# Patient Record
Sex: Male | Born: 1935 | Race: White | Hispanic: No | Smoking: Former smoker
Health system: Southern US, Community
[De-identification: ages and names within clinical notes are randomized; demographics above are authoritative.]

## PROBLEM LIST (undated history)

## (undated) DIAGNOSIS — K8020 Calculus of gallbladder without cholecystitis without obstruction: Secondary | ICD-10-CM

## (undated) DIAGNOSIS — C61 Malignant neoplasm of prostate: Secondary | ICD-10-CM

## (undated) DIAGNOSIS — K219 Gastro-esophageal reflux disease without esophagitis: Secondary | ICD-10-CM

## (undated) DIAGNOSIS — R972 Elevated prostate specific antigen [PSA]: Secondary | ICD-10-CM

## (undated) DIAGNOSIS — J309 Allergic rhinitis, unspecified: Secondary | ICD-10-CM

## (undated) DIAGNOSIS — K259 Gastric ulcer, unspecified as acute or chronic, without hemorrhage or perforation: Secondary | ICD-10-CM

## (undated) DIAGNOSIS — I2510 Atherosclerotic heart disease of native coronary artery without angina pectoris: Secondary | ICD-10-CM

## (undated) DIAGNOSIS — IMO0002 Reserved for concepts with insufficient information to code with codable children: Secondary | ICD-10-CM

## (undated) DIAGNOSIS — M1710 Unilateral primary osteoarthritis, unspecified knee: Secondary | ICD-10-CM

## (undated) DIAGNOSIS — I4891 Unspecified atrial fibrillation: Secondary | ICD-10-CM

## (undated) DIAGNOSIS — R600 Localized edema: Secondary | ICD-10-CM

## (undated) DIAGNOSIS — L0292 Furuncle, unspecified: Secondary | ICD-10-CM

## (undated) DIAGNOSIS — L0293 Carbuncle, unspecified: Secondary | ICD-10-CM

## (undated) HISTORY — DX: Elevated prostate specific antigen (PSA): R97.20

## (undated) HISTORY — DX: Calculus of gallbladder without cholecystitis without obstruction: K80.20

## (undated) HISTORY — DX: Furuncle, unspecified: L02.92

## (undated) HISTORY — DX: Allergic rhinitis, unspecified: J30.9

## (undated) HISTORY — DX: Gastro-esophageal reflux disease without esophagitis: K21.9

## (undated) HISTORY — DX: Gastric ulcer, unspecified as acute or chronic, without hemorrhage or perforation: K25.9

## (undated) HISTORY — DX: Unspecified atrial fibrillation: I48.91

## (undated) HISTORY — PX: OTHER SURGICAL HISTORY: SHX169

## (undated) HISTORY — PX: TOTAL HIP ARTHROPLASTY: SHX124

## (undated) HISTORY — DX: Carbuncle, unspecified: L02.93

## (undated) HISTORY — DX: Unilateral primary osteoarthritis, unspecified knee: M17.10

## (undated) HISTORY — DX: Atherosclerotic heart disease of native coronary artery without angina pectoris: I25.10

## (undated) HISTORY — DX: Localized edema: R60.0

## (undated) HISTORY — DX: Reserved for concepts with insufficient information to code with codable children: IMO0002

---

## 2000-02-28 ENCOUNTER — Encounter: Payer: Self-pay | Admitting: Emergency Medicine

## 2000-02-28 ENCOUNTER — Inpatient Hospital Stay (HOSPITAL_COMMUNITY): Admission: EM | Admit: 2000-02-28 | Discharge: 2000-02-29 | Payer: Self-pay | Admitting: Emergency Medicine

## 2000-12-21 DIAGNOSIS — I4891 Unspecified atrial fibrillation: Secondary | ICD-10-CM

## 2000-12-21 HISTORY — DX: Unspecified atrial fibrillation: I48.91

## 2001-03-19 ENCOUNTER — Inpatient Hospital Stay (HOSPITAL_COMMUNITY): Admission: EM | Admit: 2001-03-19 | Discharge: 2001-03-23 | Payer: Self-pay | Admitting: Emergency Medicine

## 2001-03-19 ENCOUNTER — Encounter: Payer: Self-pay | Admitting: Emergency Medicine

## 2001-03-21 ENCOUNTER — Encounter: Payer: Self-pay | Admitting: Internal Medicine

## 2001-03-22 ENCOUNTER — Encounter: Payer: Self-pay | Admitting: Internal Medicine

## 2003-12-22 DIAGNOSIS — K8020 Calculus of gallbladder without cholecystitis without obstruction: Secondary | ICD-10-CM

## 2003-12-22 HISTORY — DX: Calculus of gallbladder without cholecystitis without obstruction: K80.20

## 2004-04-26 ENCOUNTER — Emergency Department (HOSPITAL_COMMUNITY): Admission: EM | Admit: 2004-04-26 | Discharge: 2004-04-26 | Payer: Self-pay

## 2004-04-29 ENCOUNTER — Inpatient Hospital Stay (HOSPITAL_COMMUNITY): Admission: EM | Admit: 2004-04-29 | Discharge: 2004-04-29 | Payer: Self-pay | Admitting: Emergency Medicine

## 2004-05-05 ENCOUNTER — Encounter: Admission: RE | Admit: 2004-05-05 | Discharge: 2004-05-05 | Payer: Self-pay | Admitting: Family Medicine

## 2004-06-30 ENCOUNTER — Ambulatory Visit (HOSPITAL_COMMUNITY): Admission: RE | Admit: 2004-06-30 | Discharge: 2004-06-30 | Payer: Self-pay | Admitting: Internal Medicine

## 2004-07-22 ENCOUNTER — Encounter (INDEPENDENT_AMBULATORY_CARE_PROVIDER_SITE_OTHER): Payer: Self-pay | Admitting: Specialist

## 2004-07-22 ENCOUNTER — Ambulatory Visit (HOSPITAL_COMMUNITY): Admission: RE | Admit: 2004-07-22 | Discharge: 2004-07-22 | Payer: Self-pay | Admitting: Internal Medicine

## 2005-01-05 ENCOUNTER — Ambulatory Visit: Payer: Self-pay | Admitting: Internal Medicine

## 2005-03-16 ENCOUNTER — Ambulatory Visit (HOSPITAL_COMMUNITY): Admission: RE | Admit: 2005-03-16 | Discharge: 2005-03-16 | Payer: Self-pay | Admitting: Internal Medicine

## 2005-03-16 ENCOUNTER — Ambulatory Visit: Payer: Self-pay | Admitting: Internal Medicine

## 2005-03-23 ENCOUNTER — Ambulatory Visit: Payer: Self-pay | Admitting: Internal Medicine

## 2005-05-12 ENCOUNTER — Encounter: Payer: Self-pay | Admitting: Internal Medicine

## 2005-06-27 ENCOUNTER — Emergency Department (HOSPITAL_COMMUNITY): Admission: EM | Admit: 2005-06-27 | Discharge: 2005-06-27 | Payer: Self-pay | Admitting: Emergency Medicine

## 2005-06-30 ENCOUNTER — Ambulatory Visit: Payer: Self-pay | Admitting: Internal Medicine

## 2005-06-30 ENCOUNTER — Inpatient Hospital Stay (HOSPITAL_COMMUNITY): Admission: EM | Admit: 2005-06-30 | Discharge: 2005-07-03 | Payer: Self-pay | Admitting: Internal Medicine

## 2005-07-02 ENCOUNTER — Ambulatory Visit: Payer: Self-pay | Admitting: Gastroenterology

## 2005-07-07 ENCOUNTER — Ambulatory Visit: Payer: Self-pay | Admitting: Internal Medicine

## 2005-07-10 ENCOUNTER — Ambulatory Visit: Payer: Self-pay | Admitting: Internal Medicine

## 2005-07-14 ENCOUNTER — Encounter: Payer: Self-pay | Admitting: Internal Medicine

## 2005-08-31 ENCOUNTER — Ambulatory Visit: Payer: Self-pay | Admitting: Internal Medicine

## 2005-10-22 ENCOUNTER — Ambulatory Visit: Payer: Self-pay | Admitting: Internal Medicine

## 2006-02-12 ENCOUNTER — Ambulatory Visit: Payer: Self-pay | Admitting: Internal Medicine

## 2006-02-17 ENCOUNTER — Ambulatory Visit: Payer: Self-pay

## 2006-07-08 IMAGING — CT CT ABDOMEN W/ CM
1 of 5 series · 15 of 32 positions shown, 19 images · non-contrast
Comparison: none

CLINICAL DATA: Left lower quadrant pain times 2 months.  Followup inflammation of the left rib. 
 Comparison study:  04/28/04.

[Series 2: routine abdomen · axial · 0.78mm/px · z∈[-512,-156]mm · 15 of 118 slices shown, 19 images]
[im 9/118  soft-tissue]
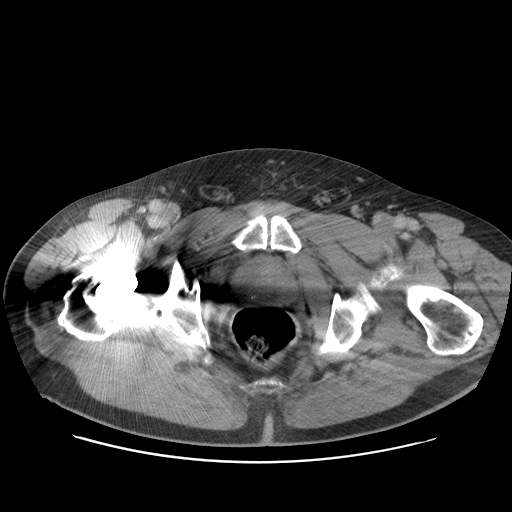
[im 9/118  bone]
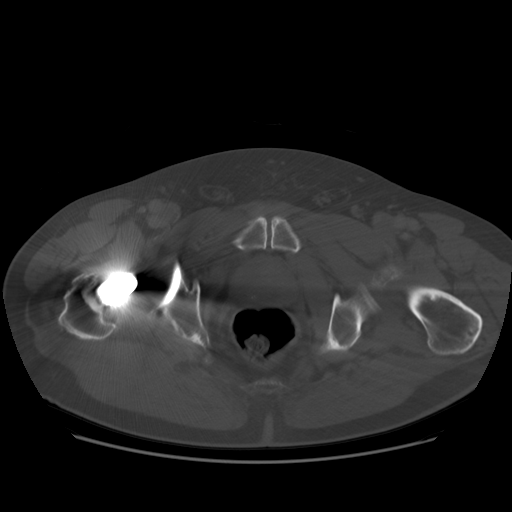
[im 18/118  soft-tissue]
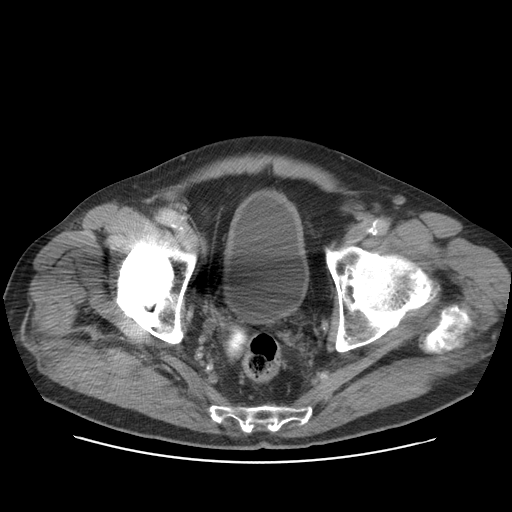
[im 27/118  soft-tissue]
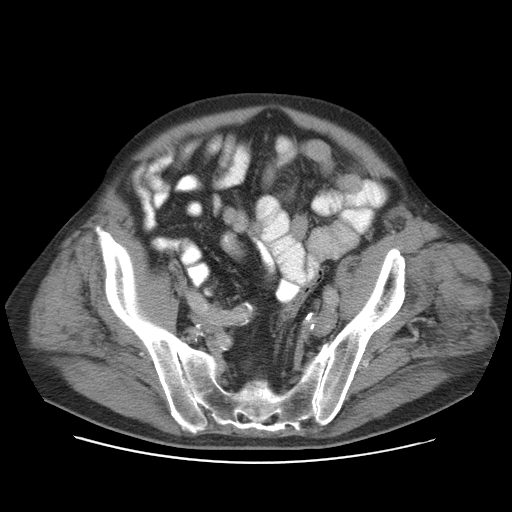
[im 35/118  soft-tissue]
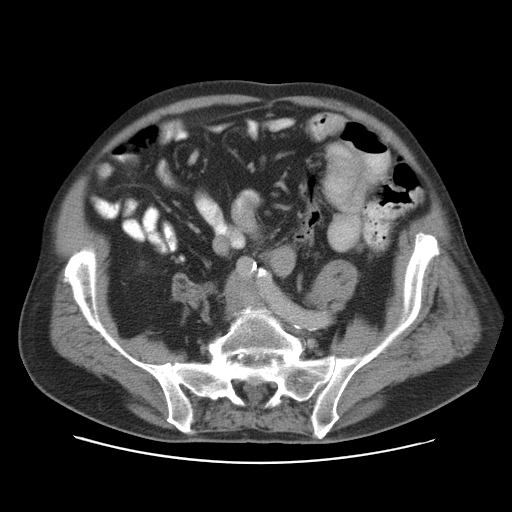
[im 44/118  soft-tissue]
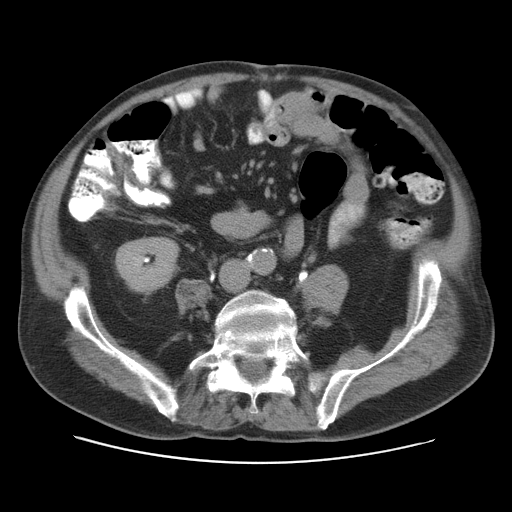
[im 53/118  soft-tissue]
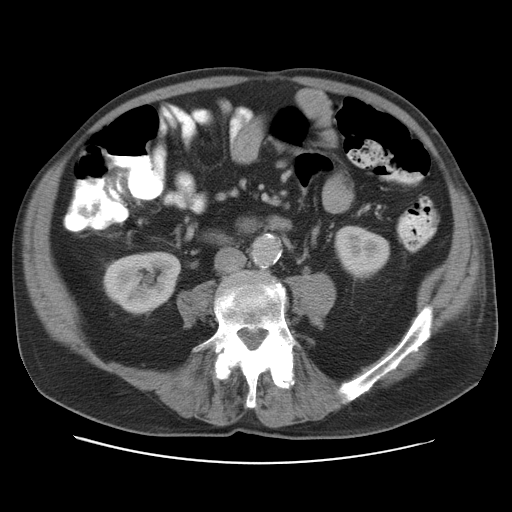
[im 61/118  soft-tissue]
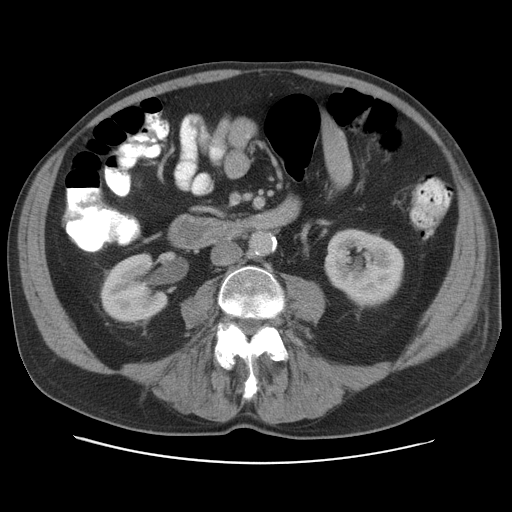
[im 70/118  soft-tissue]
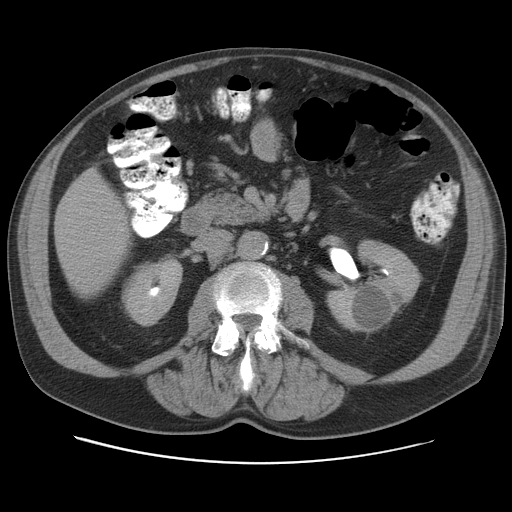
[im 79/118  soft-tissue]
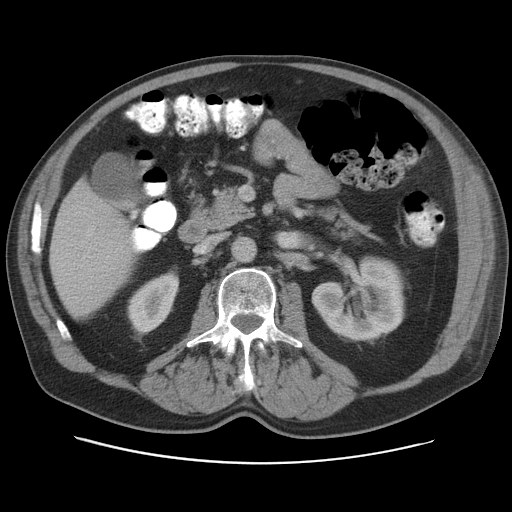
[im 79/118  bone]
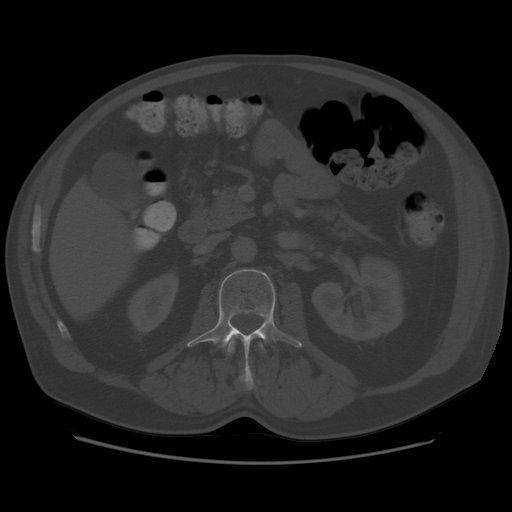
[im 87/118  soft-tissue]
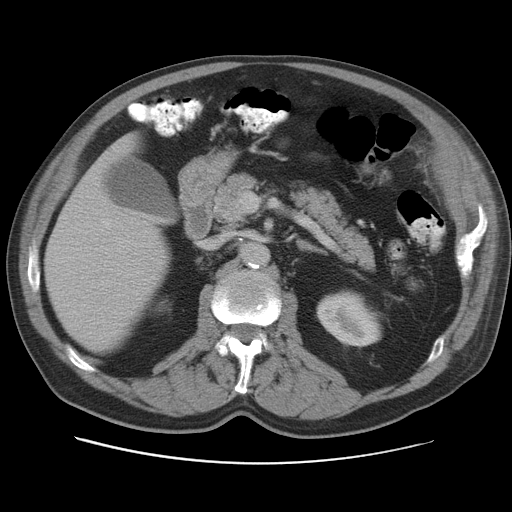
[im 96/118  soft-tissue]
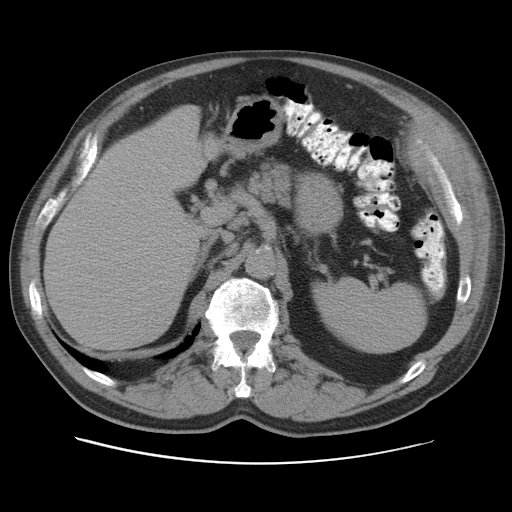
[im 100/118  lung]
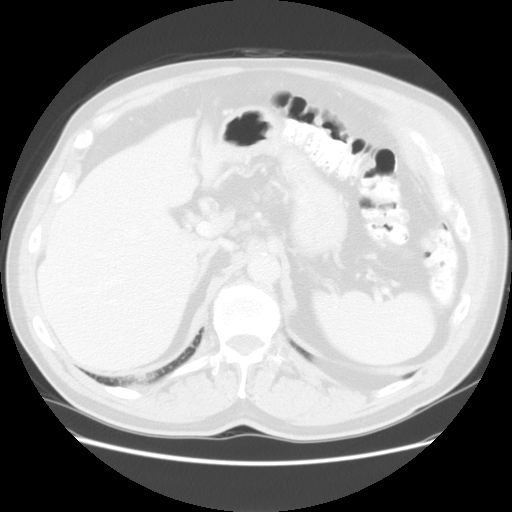
[im 105/118  soft-tissue]
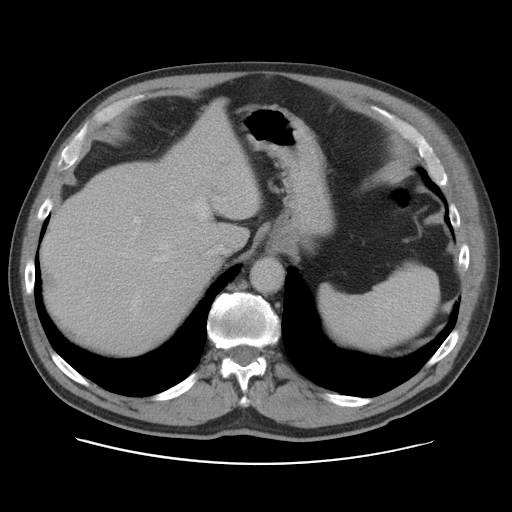
[im 105/118  lung]
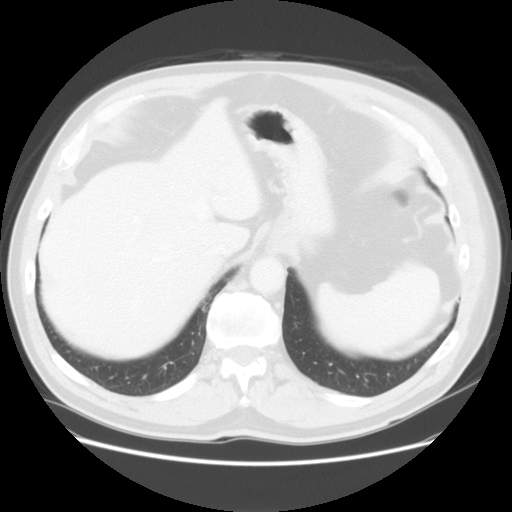
[im 109/118  lung]
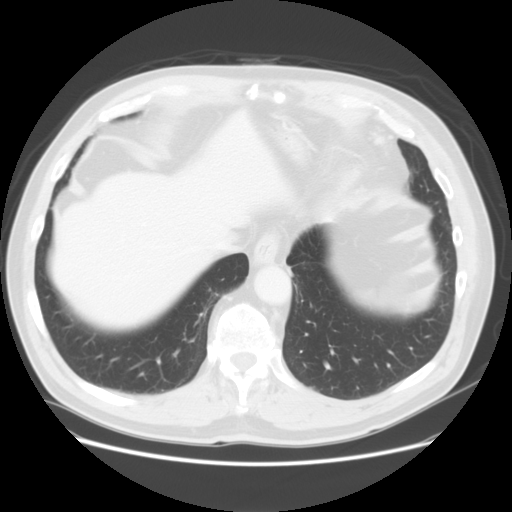
[im 113/118  soft-tissue]
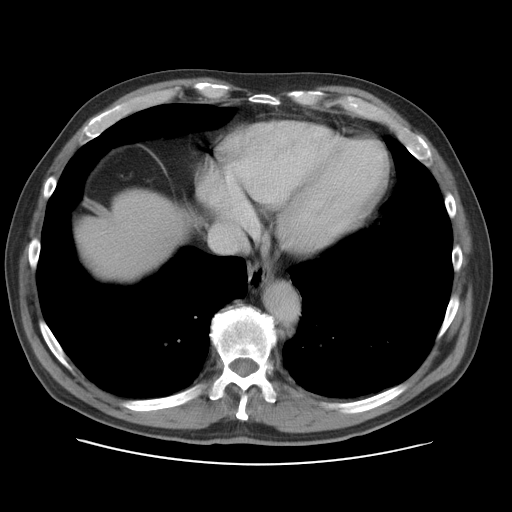
[im 113/118  lung]
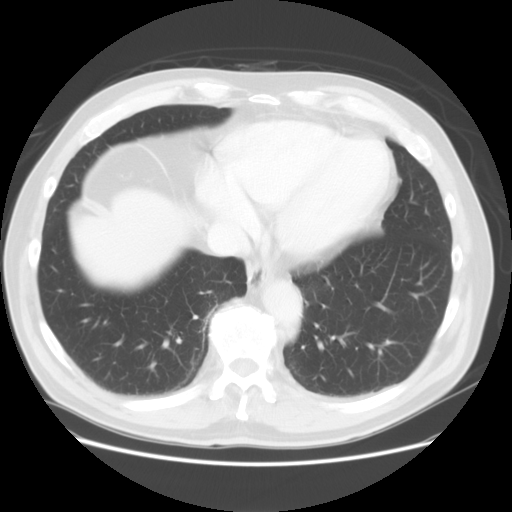

[15 of 32 positions shown; findings below may reference images not displayed]

FINDINGS: Multidetector helical scanning through the abdomen and pelvis was performed following oral contrast administration and during infusion of 100 cc of Omnipaque 300, IV. 
 CT SCAN OF THE ABDOMEN WITH INTRAVENOUS CONTRAST
FINDINGS: The liver, adrenal glands, spleen, pancreas, and bowel loops are unremarkable in appearance.  Bilateral renal cysts are again identified but stable.  There is layering sludge/stones within the gallbladder.  This is a stable finding.  The fullness/inflammation in the left lower chest wall/upper abdominal wall surrounding the anterior aspects of approximately the 8th or 9th rib is more pronounced than on the previous exam.  This may represent an infectious or inflammatory process, but neoplasm could not be excluded.  No evidence of enlarged retroperitoneal adenopathy or free fluid.  There is no abdominal aortic aneurysm.
IMPRESSION: 1.  Increasing inflammation/fullness of the left lower chest/upper abdominal wall.  Findings may be secondary to infectious or inflammatory process, but neoplasm could not be excluded.  Tissue sampling may be beneficial. 
 2.  Small amounts of gallstones or sludge in the gallbladder. 
 CT SCAN OF THE PELVIS WITH INTRAVENOUS CONTRAST
FINDINGS: There are no abnormal fluid collections, adenopathy, or soft tissue masses.  Visualized bowel loops appear normal.  The patient is status post right hip arthroplasty.
IMPRESSION: No radiographic evidence of an acute abnormality in the pelvis.

## 2006-10-06 ENCOUNTER — Ambulatory Visit: Payer: Self-pay | Admitting: Internal Medicine

## 2006-11-25 ENCOUNTER — Ambulatory Visit: Payer: Self-pay | Admitting: Internal Medicine

## 2007-01-14 ENCOUNTER — Ambulatory Visit: Payer: Self-pay | Admitting: Internal Medicine

## 2007-05-30 ENCOUNTER — Encounter: Payer: Self-pay | Admitting: Internal Medicine

## 2007-07-15 DIAGNOSIS — L0292 Furuncle, unspecified: Secondary | ICD-10-CM | POA: Insufficient documentation

## 2007-07-15 DIAGNOSIS — K259 Gastric ulcer, unspecified as acute or chronic, without hemorrhage or perforation: Secondary | ICD-10-CM | POA: Insufficient documentation

## 2007-07-15 DIAGNOSIS — L0293 Carbuncle, unspecified: Secondary | ICD-10-CM

## 2007-07-15 DIAGNOSIS — I2510 Atherosclerotic heart disease of native coronary artery without angina pectoris: Secondary | ICD-10-CM | POA: Insufficient documentation

## 2007-07-15 DIAGNOSIS — K8020 Calculus of gallbladder without cholecystitis without obstruction: Secondary | ICD-10-CM | POA: Insufficient documentation

## 2007-09-19 ENCOUNTER — Ambulatory Visit: Payer: Self-pay | Admitting: Internal Medicine

## 2008-01-30 ENCOUNTER — Encounter: Payer: Self-pay | Admitting: Internal Medicine

## 2008-05-22 ENCOUNTER — Ambulatory Visit: Payer: Self-pay | Admitting: Internal Medicine

## 2008-06-17 DIAGNOSIS — M169 Osteoarthritis of hip, unspecified: Secondary | ICD-10-CM

## 2008-06-17 DIAGNOSIS — M1710 Unilateral primary osteoarthritis, unspecified knee: Secondary | ICD-10-CM

## 2008-06-17 DIAGNOSIS — M1610 Unilateral primary osteoarthritis, unspecified hip: Secondary | ICD-10-CM | POA: Insufficient documentation

## 2008-06-17 DIAGNOSIS — J309 Allergic rhinitis, unspecified: Secondary | ICD-10-CM | POA: Insufficient documentation

## 2008-06-17 DIAGNOSIS — R972 Elevated prostate specific antigen [PSA]: Secondary | ICD-10-CM

## 2008-09-19 ENCOUNTER — Ambulatory Visit: Payer: Self-pay | Admitting: Internal Medicine

## 2008-10-10 ENCOUNTER — Telehealth: Payer: Self-pay | Admitting: Internal Medicine

## 2009-06-12 ENCOUNTER — Telehealth: Payer: Self-pay | Admitting: Internal Medicine

## 2009-09-04 ENCOUNTER — Encounter: Payer: Self-pay | Admitting: Internal Medicine

## 2009-09-18 ENCOUNTER — Ambulatory Visit: Payer: Self-pay | Admitting: Internal Medicine

## 2010-01-13 ENCOUNTER — Encounter: Payer: Self-pay | Admitting: Internal Medicine

## 2010-02-04 ENCOUNTER — Ambulatory Visit: Payer: Self-pay | Admitting: Internal Medicine

## 2010-02-04 DIAGNOSIS — C44300 Unspecified malignant neoplasm of skin of unspecified part of face: Secondary | ICD-10-CM | POA: Insufficient documentation

## 2010-02-04 DIAGNOSIS — C44309 Unspecified malignant neoplasm of skin of other parts of face: Secondary | ICD-10-CM | POA: Insufficient documentation

## 2010-09-30 ENCOUNTER — Ambulatory Visit: Payer: Self-pay | Admitting: Internal Medicine

## 2011-01-20 NOTE — Assessment & Plan Note (Signed)
Summary: PLACES ON SKIN NEED TO BE CHECKED OUT/NWS  #   Vital Signs:  Patient profile:   77 year old male Height:      72 inches Weight:      198 pounds O2 Sat:      98 % on Room air Temp:     98.2 degrees F oral Pulse rate:   62 / minute BP sitting:   146 / 70  (left arm) Cuff size:   regular  Vitals Entered By: [name] CMA (February 04, 2010 11:03 AM)  O2 Flow:  Room air CC: pt here to have place on his right cheek looked at and he also has complaint of pain in right big toes and questions if it could be gout/ ab   Primary Care Provider:  [name]  CC:  pt here to have place on his right cheek looked at and he also has complaint of pain in right big toes and questions if it could be gout/ ab.  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  This has been present without change for a long time.   Current Medications (verified): 1)  Atenolol 25 Mg  Tabs (Atenolol) .... Take 1 Tablet By Mouth Once A Day 2)  Low-Dose Aspirin 81 Mg  Tabs (Aspirin) .... One By Mouth Once Daily 3)  Multivitamins   Tabs (Multiple Vitamin) .... One By Mouth Once Daily 4)  Omega 3 1200 Mg  Caps (Omega-3 Fatty Acids) .... Take 2 Tablet By Mouth Once A Day 5)  Bactericin 500 Unit/gm  Oint (Bacitracin) .... Apply Two Times A Day As Needed 6)  Vitamin B Complex-C   Caps (B Complex-C) .... Take 1 Tablet By Mouth Once A Day 7)  Vitamin D 1000 Unit  Tabs (Cholecalciferol) .... Take 1 Tablet By Mouth Once A Day 8)  Clotrimazole-Betamethasone 1-0.05 % Crea (Clotrimazole-Betamethasone) .... Apply Two Times A Day 9)  Calcium Carbonate-Vitamin D 600-400 Mg-Unit Tabs (Calcium Carbonate-Vitamin D) .... Daily  Allergies (verified): 1)  ! Sulfa  Past History:  Past Medical History: Last updated: 06-14-08 ATRIAL FIBRILLATION, HX OF (ICD-V12.59) ELEVATED PROSTATE SPECIFIC ANTIGEN  (ICD-790.93) ALLERGIC RHINITIS CAUSE UNSPECIFIED (ICD-477.9) DEGENERATIVE JOINT DISEASE, RIGHT HIP (ICD-715.95) OSTEOARTHROSIS UNSPEC WHETHER GEN/LOC LOWER LEG (ICD-715.96) CORONARY ARTERY DISEASE (ICD-414.00) GASTRIC ULCER (ICD-531.90) GALLSTONES (ICD-574.20) CARBUNCLE/FURUNCLE NOS (ICD-680.9)  Past Surgical History: Last updated: 06/14/08 Total hip replacement-right  Family History: Last updated: 06/14/08 both parents died in their 61's - "old age."  Social History: Last updated: 06-14-2008 HSG served in armed forces widowed after a long marriage 2 children - neither live nearby  Risk Factors: Alcohol Use: 1 (Jun 14, 2008) Exercise: no (2008-06-14)  Risk Factors: Smoking Status: quit (07/15/2007)  Review of Systems  The patient denies anorexia, fever, weight loss, weight gain, chest pain, syncope, dyspnea on exertion, abdominal pain, hematuria, muscle weakness, difficulty walking, depression, and enlarged lymph nodes.    Physical Exam  General:  Well-developed,well-nourished,in no acute distress; alert,appropriate and cooperative throughout examination Head:  Normocephalic and atraumatic without obvious abnormalities. No apparent alopecia or balding. Lungs:  normal respiratory effort and normal breath sounds.   Heart:  normal rate and regular rhythm.   Skin:  small 3mm lesion on the right cheek with a crater-like top and many small vessels. No drainage or open wound.  Psych:  Oriented X3, memory intact for recent and  remote, and good eye contact.     Impression & Recommendations:  Problem # 1:  CARCINOMA, BASAL CELL, FACE (ICD-173.3) Facial lesion very likely to be a basal cell carcinoma. Discussed the natural history of such a lesion. He is offered excisional biopsy but declines. He prefers to watch and wait. IF the lesion enlarges he will consider excision.   Complete Medication List: 1)  Atenolol 25 Mg Tabs (Atenolol) .... Take 1 tablet by mouth once a  day 2)  Low-dose Aspirin 81 Mg Tabs (Aspirin) .... One by mouth once daily 3)  Multivitamins Tabs (Multiple vitamin) .... One by mouth once daily 4)  Omega 3 1200 Mg Caps (Omega-3 fatty acids) .... Take 2 tablet by mouth once a day 5)  Bactericin 500 Unit/gm Oint (Bacitracin) .... Apply two times a day as needed 6)  Vitamin B Complex-c Caps (B complex-c) .... Take 1 tablet by mouth once a day 7)  Vitamin D 1000 Unit Tabs (Cholecalciferol) .... Take 1 tablet by mouth once a day 8)  Clotrimazole-betamethasone 1-0.05 % Crea (Clotrimazole-betamethasone) .... Apply two times a day 9)  Calcium Carbonate-vitamin D 600-400 Mg-unit Tabs (Calcium carbonate-vitamin d) .... Daily Prescriptions: CLOTRIMAZOLE-BETAMETHASONE 1-0.05 % CREA (CLOTRIMAZOLE-BETAMETHASONE) Apply two times a day  #15 Gram x 2   Entered and Authorized by:   Jacques Navy MD   Signed by:   Jacques Navy MD on 02/04/2010   Method used:   Electronically to        [facility]. [phone]* (retail)       [address]       [place], [place]  [address]       Ph: [phone]       Fax: [phone]   RxID:   [id number]

## 2011-01-20 NOTE — Assessment & Plan Note (Signed)
Summary: FLU SHOT/MEN/CD  Nurse Visit   Allergies: 1)  ! Sulfa  Immunizations Administered:  Pneumonia Vaccine:    Vaccine Type: Pneumovax (Medicare)    Site: right deltoid    Mfr: Merck    Dose: 0.5 ml    Route: IM    Given by: Jarome Lamas    Exp. Date: 03/08/2012    Lot #: [id number]    VIS given: 11/25/09 version given September 30, 2010.  Orders Added: 1)  Flu Vaccine 65yrs + MEDICARE PATIENTS [Q2039] 2)  Administration Flu vaccine - MCR [G0008] 3)  Pneumococcal Vaccine [90732] 4)  Admin 1st Vaccine [[address]]       Flu Vaccine Consent Questions     Do you have a history of severe allergic reactions to this vaccine? no    Any prior history of allergic reactions to egg and/or gelatin? no    Do you have a sensitivity to the preservative Thimersol? no    Do you have a past history of Guillan-Barre Syndrome? no    Do you currently have an acute febrile illness? no    Have you ever had a severe reaction to latex? no    Vaccine information given and explained to patient? yes    Are you currently pregnant? no    Lot Number:[id number]   Exp Date:06/20/2011   Site Given  Left Deltoid IM

## 2011-02-11 ENCOUNTER — Encounter: Payer: Self-pay | Admitting: Internal Medicine

## 2011-02-11 ENCOUNTER — Telehealth: Payer: Self-pay | Admitting: Internal Medicine

## 2011-02-11 ENCOUNTER — Ambulatory Visit (INDEPENDENT_AMBULATORY_CARE_PROVIDER_SITE_OTHER): Payer: Medicare Other | Admitting: Internal Medicine

## 2011-02-11 DIAGNOSIS — I2510 Atherosclerotic heart disease of native coronary artery without angina pectoris: Secondary | ICD-10-CM

## 2011-02-11 DIAGNOSIS — N401 Enlarged prostate with lower urinary tract symptoms: Secondary | ICD-10-CM | POA: Insufficient documentation

## 2011-02-12 ENCOUNTER — Ambulatory Visit: Payer: Self-pay | Admitting: Internal Medicine

## 2011-02-12 ENCOUNTER — Encounter: Payer: Self-pay | Admitting: Cardiology

## 2011-02-12 ENCOUNTER — Ambulatory Visit (INDEPENDENT_AMBULATORY_CARE_PROVIDER_SITE_OTHER): Payer: Medicare Other | Admitting: Cardiology

## 2011-02-12 DIAGNOSIS — I4892 Unspecified atrial flutter: Secondary | ICD-10-CM
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
## 2011-02-17 NOTE — Progress Notes (Signed)
Summary: Palpitations  Phone Note Call from Patient   Caller: Patient Summary of Call: pt states he has been having palpitaions with some SOB since 4am. He denies CP, arm/neck pain, N&V.  He states he would like to be seen to address this with Dr. [name]. Per Ami, I advised pt to be here at 9am. Initial call taken by: Lanier Prude, [facility]),  February 11, 2011 8:22 AM  Follow-up for Phone Call        patient seen Follow-up by: Jacques Navy MD,  February 11, 2011 12:55 PM

## 2011-02-17 NOTE — Progress Notes (Signed)
Summary: MED LIST  Phone Note Call from Patient   Summary of Call: Patient is requesting copy of med list to be mailed to pt's hm address.  Initial call taken by: Lamar Sprinkles, CMA,  February 11, 2011 3:25 PM  Follow-up for Phone Call        mailed Follow-up by: Lamar Sprinkles, CMA,  February 11, 2011 6:36 PM

## 2011-02-17 NOTE — Assessment & Plan Note (Signed)
Summary: palpitations/lb   Vital Signs:  Patient profile:   77 year old male Height:      72 inches Weight:      208 pounds BMI:     28.31 O2 Sat:      97 % on Room air Temp:     97.7 degrees F oral Pulse rate:   120 / minute BP sitting:   120 / 78  (left arm) Cuff size:   regular  Vitals Entered By: [name] CMA (February 11, 2011 9:09 AM)  O2 Flow:  Room air CC: pt here with c/o heart palpatations, he states he did just start on doxazosin and finasteride and thinks it may be side effect from medications/ ab   Primary Care Provider:  Shanley Furlough  CC:  pt here with c/o heart palpatations and he states he did just start on doxazosin and finasteride and thinks it may be side effect from medications/ ab.  History of Present Illness: [name] presents for episodes of rapid heart rate with SOB. This will happen spontaneously including when asleep. He is more tachycardic on a on-going basis for the past several days then usual. He has not had chest pain, no change in diet. Reviewed chart - last visit in Feb '11 with a HR 62. He has recently started doxazosin and finasteride and is concerned about drug interactions.   eChart reviewed: hospitalization in '02 for syncope diagnosed with PSVT/a. fib and had a cardiac cath with single vessel disease - 70-80% LAD lesion that Dr. Clarene Duke did not feel needed intervention.  Current Medications (verified): 1)  Atenolol 25 Mg  Tabs (Atenolol) .... Take 1 Tablet By Mouth Once A Day 2)  Low-Dose Aspirin 81 Mg  Tabs (Aspirin) .... One By Mouth Once Daily 3)  Multivitamins   Tabs (Multiple Vitamin) .... One By Mouth Once Daily 4)  Omega 3 1200 Mg  Caps (Omega-3 Fatty Acids) .... Take 2 Tablet By Mouth Once A Day 5)  Bactericin 500 Unit/gm  Oint (Bacitracin) .... Apply Two Times A Day As Needed 6)  Vitamin B Complex-C   Caps (B Complex-C) .... Take 1 Tablet By Mouth Once A Day 7)  Vitamin D 1000 Unit  Tabs (Cholecalciferol) .... Take 1 Tablet By Mouth  Once A Day 8)  Clotrimazole-Betamethasone 1-0.05 % Crea (Clotrimazole-Betamethasone) .... Apply Two Times A Day 9)  Calcium Carbonate-Vitamin D 600-400 Mg-Unit Tabs (Calcium Carbonate-Vitamin D) .... Daily 10)  Doxazosin Mesylate 2 Mg Tabs (Doxazosin Mesylate) .Marland Kitchen.. 1 Tablet Qd 11)  Finasteride 5 Mg Tabs (Finasteride) .Marland Kitchen.. 1 Tablet Once Daily  Allergies (verified): 1)  ! Sulfa  Past History:  Past Medical History: Last updated: 06/01/08 ATRIAL FIBRILLATION, HX OF (ICD-V12.59) ELEVATED PROSTATE SPECIFIC ANTIGEN (ICD-790.93) ALLERGIC RHINITIS CAUSE UNSPECIFIED (ICD-477.9) DEGENERATIVE JOINT DISEASE, RIGHT HIP (ICD-715.95) OSTEOARTHROSIS UNSPEC WHETHER GEN/LOC LOWER LEG (ICD-715.96) CORONARY ARTERY DISEASE (ICD-414.00) GASTRIC ULCER (ICD-531.90) GALLSTONES (ICD-574.20) CARBUNCLE/FURUNCLE NOS (ICD-680.9)  Past Surgical History: Last updated: 2008/06/01 Total hip replacement-right  Family History: Last updated: 06/01/08 both parents died in their 40's - "old age."  Social History: Last updated: 2008/06/01 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    Physical Exam  General:  alert, well-developed, well-nourished, and well-hydrated white male looking much younger than his  stated age.   Head:  normocephalic and atraumatic.   Eyes:  vision grossly intact, pupils equal, and pupils round.   Neck:  supple.   Lungs:  no intercostal retractions, normal breath sounds, no crackles, and no wheezes.   Heart:  very rapid tachycardia that sounds regular. No murmur.    Impression & Recommendations:  Problem # 1:  CORONARY ARTERY DISEASE (ICD-414.00)  Patient is presenting with  tachycardia - PSVT with underlying a. flutter. EKG shows strain with ST depression. Patient is asymptomatic. Discussed with Dr. Shirlee Latch.  Plan - patient instructed that for any pain or discomfort to go to [facility] without delay           start diltiazem ER 180 today and take AM dose           to see Dr. Shirlee Latch Thursday, 2/23 at 14:30 hrs.  Pt aware and will be called to be sure he knows time of appointment  His updated medication list for this problem includes:    Atenolol 25 Mg Tabs (Atenolol) .Marland Kitchen... Take 1 tablet by mouth once a day    Low-dose Aspirin 81 Mg Tabs (Aspirin) ..... One by mouth once daily    Doxazosin Mesylate 2 Mg Tabs (Doxazosin mesylate) .Marland Kitchen... 1 tablet qd    Diltiazem Hcl Er Beads 180 Mg Xr24h-cap (Diltiazem hcl er beads) .Marland Kitchen... 1 by mouth once daily  Orders: EKG w/ Interpretation (93000)  Problem # 2:  BENIGN PROSTATIC HYPERTROPHY, WITH URINARY OBSTRUCTION (ICD-600.01) Patient with obstructive symptoms and he has failed to respond to flomax in the past. At the [place] he was recently started on doxazosin and finasteride.  Plan - continue meds           if no response may need intervention.   His updated medication list for this problem includes:    Doxazosin Mesylate 2 Mg Tabs (Doxazosin mesylate) .Marland Kitchen... 1 tablet qd    Finasteride 5 Mg Tabs (Finasteride) .Marland Kitchen... 1 tablet once daily  Complete Medication List: 1)  Atenolol 25 Mg Tabs (Atenolol) .... Take 1 tablet by mouth once a day 2)  Low-dose Aspirin 81 Mg Tabs (Aspirin) .... One by mouth once daily 3)  Multivitamins Tabs (Multiple vitamin) .... One by mouth once daily 4)  Omega 3 1200 Mg Caps (Omega-3 fatty acids) .... Take 2 tablet by mouth once a day 5)  Bactericin 500 Unit/gm Oint (Bacitracin) .... Apply two times a day as needed 6)  Vitamin B Complex-c Caps (B complex-c) .... Take 1 tablet by mouth once a day 7)  Vitamin D 1000 Unit Tabs (Cholecalciferol) .... Take 1 tablet by mouth once a day 8)   Clotrimazole-betamethasone 1-0.05 % Crea (Clotrimazole-betamethasone) .... Apply two times a day 9)  Calcium Carbonate-vitamin D 600-400 Mg-unit Tabs (Calcium carbonate-vitamin d) .... Daily 10)  Doxazosin Mesylate 2 Mg Tabs (Doxazosin mesylate) .Marland Kitchen.. 1 tablet qd 11)  Finasteride 5 Mg Tabs (Finasteride) .Marland Kitchen.. 1 tablet once daily 12)  Diltiazem Hcl Er Beads 180 Mg Xr24h-cap (Diltiazem hcl er beads) .Marland Kitchen.. 1 by mouth once daily Prescriptions: DILTIAZEM HCL ER BEADS 180 MG XR24H-CAP (DILTIAZEM HCL ER BEADS) 1 by mouth once daily  #30 x 12   Entered and Authorized by:   Jacques Navy MD   Signed by:   Jacques Navy MD on 02/11/2011   Method used:   Electronically to        [facility]* (retail)       [address]  [address]       [place], [place]  [address]       Ph: [phone]       Fax: [phone]   RxID:   [id number]    Orders Added: 1)  Est. Patient Level IV [84132] 2)  EKG w/ Interpretation [93000]

## 2011-02-26 ENCOUNTER — Ambulatory Visit (HOSPITAL_COMMUNITY): Payer: Medicare Other | Attending: Internal Medicine

## 2011-02-26 ENCOUNTER — Telehealth: Payer: Self-pay | Admitting: Cardiology

## 2011-02-26 DIAGNOSIS — K219 Gastro-esophageal reflux disease without esophagitis: Secondary | ICD-10-CM | POA: Insufficient documentation

## 2011-02-26 DIAGNOSIS — R55 Syncope and collapse: Secondary | ICD-10-CM | POA: Insufficient documentation

## 2011-02-26 DIAGNOSIS — I4892 Unspecified atrial flutter: Secondary | ICD-10-CM

## 2011-02-26 DIAGNOSIS — I059 Rheumatic mitral valve disease, unspecified: Secondary | ICD-10-CM | POA: Insufficient documentation

## 2011-03-03 NOTE — Progress Notes (Signed)
Summary: pt reurning call  Phone Note Call from Patient Call back at Home Phone [phone]   Caller: Patient Summary of Call: Pt returning call Initial call taken by: Judie Grieve,  February 26, 2011 3:53 PM  Follow-up for Phone Call        I talked with pt about echo results

## 2011-03-06 ENCOUNTER — Telehealth: Payer: Self-pay | Admitting: Cardiology

## 2011-03-06 ENCOUNTER — Encounter (INDEPENDENT_AMBULATORY_CARE_PROVIDER_SITE_OTHER): Payer: Medicare Other

## 2011-03-06 DIAGNOSIS — I4892 Unspecified atrial flutter: Secondary | ICD-10-CM

## 2011-03-09 ENCOUNTER — Ambulatory Visit (INDEPENDENT_AMBULATORY_CARE_PROVIDER_SITE_OTHER): Payer: Medicare Other | Admitting: Internal Medicine

## 2011-03-09 ENCOUNTER — Encounter: Payer: Self-pay | Admitting: Internal Medicine

## 2011-03-09 DIAGNOSIS — I861 Scrotal varices: Secondary | ICD-10-CM | POA: Insufficient documentation

## 2011-03-09 DIAGNOSIS — R609 Edema, unspecified: Secondary | ICD-10-CM | POA: Insufficient documentation

## 2011-03-09 DIAGNOSIS — I4892 Unspecified atrial flutter: Secondary | ICD-10-CM

## 2011-03-10 NOTE — Progress Notes (Addendum)
Summary: monitor readings  Phone Note Outgoing Call   Call placed by: Meredith Staggers, RN,  March 06, 2011 4:30 PM Call placed to: Patient Summary of Call: received monitor strips from [facility] from 10:30 this am pt was in a-fib w/rate of 130s at 3:30 pt was back in SR w/1st degree AV block rate of 60, reviewed w/Dr Shirlee Latch, he would like for pt to increase diltiazem to 240mg  daily and start xarelto 15mg  daily, if too expensive can switch to coumadin, discuss reasoning for xarelto pt is agreealbe to start meds, rx sent into pharmacy, he will keep f/u appt on 4/11 and will cont. to wear monitor Meredith Staggers, RN  March 06, 2011 4:36 PM     New/Updated Medications: DILTIAZEM HCL CR 240 MG XR24H-CAP (DILTIAZEM HCL) Take 1 tablet by mouth once a day XARELTO 15 MG TABS (RIVAROXABAN) Take 1 tablet by mouth once a day Prescriptions: XARELTO 15 MG TABS (RIVAROXABAN) Take 1 tablet by mouth once a day  #30 x 6   Entered by:   Meredith Staggers, RN   Authorized by:   Marca Ancona, MD   Signed by:   Meredith Staggers, RN on 03/06/2011   Method used:   Electronically to        [facility] (retail)       [address]       [place], [place]  [address]       Ph: [phone]       Fax: [phone]   RxID:   [id number] DILTIAZEM HCL CR 240 MG XR24H-CAP (DILTIAZEM HCL) Take 1 tablet by mouth once a day  #30 x 6   Entered by:   Meredith Staggers, RN   Authorized by:   Marca Ancona, MD   Signed by:   Meredith Staggers, RN on 03/06/2011   Method used:   Electronically to        [facility] (retail)       [address]       [place], [place]  [address]       Ph: [phone]       Fax: [phone]   RxID:   [id number]

## 2011-03-11 ENCOUNTER — Telehealth: Payer: Self-pay | Admitting: *Deleted

## 2011-03-11 ENCOUNTER — Encounter: Payer: Self-pay | Admitting: Cardiology

## 2011-03-11 NOTE — Telephone Encounter (Signed)
Stop diltiazem, continue monitor, add him on to my schedule.

## 2011-03-11 NOTE — Progress Notes (Signed)
Stop diltiazem and I will see him as soon as he can get in.  Will see if pauses resolve off diltiazem.  Continue monitor.

## 2011-03-11 NOTE — Telephone Encounter (Signed)
Spoke to patient regarding results coming over fax from [facility].  Has had 3 episodes of 3 second pauses since 9:50am today.  Per Dr. Eden Emms (DOD), needs to stop Diltiazem and see Dr. Shirlee Latch either this week or next week.  Patient denies any problems-states he feels fine.  Patient to receive phone call from scheduler for Dr. Shirlee Latch later today with appt.

## 2011-03-11 NOTE — Progress Notes (Signed)
Spoke to DOD Dr. Eden Emms regarding result of monitor that came over this AM. - 3 second pause.  Per Dr. Eden Emms, OK to wait for Dr. Alford Highland return to office.  He does suggest patient to f/u with [facility] and possibly see either Dr. Graciela Husbands or Dr. Johney Frame for possible pacemaker.  Will notify Dr. Shirlee Latch.

## 2011-03-13 NOTE — Telephone Encounter (Signed)
Dr Shirlee Latch received additional monitor results 03/13/11 7:49 at fib , pause 3.2 sec--he recommended  pt decrease Atenolol to 12.5mg  daily

## 2011-03-13 NOTE — Progress Notes (Signed)
Dr Shirlee Latch talked with pt by telephone 03/13/11. Pt will remain off DIltiazem. He is scheduled to see Dr Shirlee Latch 03/19/11.

## 2011-03-13 NOTE — Telephone Encounter (Signed)
Dr Shirlee Latch talked with pt by telephone. Pt will not decrease Atenolol to 12.5mg  daily. He will continue Atenolol 25mg  daily. He declined appt with Dr Ladona Ridgel scheduled for 03/16/11 for possible PCM evaluation. Pt will see Dr Shirlee Latch 03/19/11 at 3.

## 2011-03-16 ENCOUNTER — Ambulatory Visit: Payer: Medicare Other | Admitting: Internal Medicine

## 2011-03-18 ENCOUNTER — Encounter: Payer: Self-pay | Admitting: Cardiology

## 2011-03-19 ENCOUNTER — Ambulatory Visit (INDEPENDENT_AMBULATORY_CARE_PROVIDER_SITE_OTHER): Payer: Medicare Other | Admitting: Cardiology

## 2011-03-19 ENCOUNTER — Encounter: Payer: Self-pay | Admitting: Cardiology

## 2011-03-19 DIAGNOSIS — I2510 Atherosclerotic heart disease of native coronary artery without angina pectoris: Secondary | ICD-10-CM

## 2011-03-19 DIAGNOSIS — I495 Sick sinus syndrome: Secondary | ICD-10-CM

## 2011-03-19 NOTE — Patient Instructions (Signed)
Schedule an appointment with Dr Shirlee Latch in 6 months.(September 2012)
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
## 2011-03-20 ENCOUNTER — Telehealth: Payer: Self-pay | Admitting: Cardiology

## 2011-03-20 NOTE — Assessment & Plan Note (Signed)
Patient has paroxysmal atrial fibrillation with rapid response.  He has been noted to have up to 3.5 second pauses while in atrial fibrillation.  I took him off diltiazem, but he continued to have pauses.  He seems to be asymptomatic.  He has felt no tachypalpitations since initial visit to Dr. [name].  No chest pain, no syncope/presyncope/lightheadedness.  We had a long talk today regarding treatment options.  His CHADSVASC score is 46 (age, CAD).  Given his active lifestyle, a stroke would be devastating.  I had planned to start him on rivaroxaban 15 mg daily, but he read the side effect profile and does not want to use this med.  I think dabigatran at 150 mg bid would probably give him a bit higher bleeding risk.  I suggested coumadin in lieu of rivaroxaban.  He wants to think about this and will see if he can get coumadin paid for through the [place].  In the meantime, he will continue ASA 325 mg daily.   Mr Platten pauses when in atrial fibrillation are worrisome.  He has, however, been asymptomatic and had no documented pauses longer than 3.5 seconds.  I took him off diltiazem but will continue the atenolol for now as he has been on it long-term and I feel that he needs something for rate control when in atrial fibrillation (HR up to 140s even on atenolol). More aggressive treatment for rate control would likely require a pacemaker, a step that he strongly wishes to avoid.  For now, I will monitor his symptoms. If he develops syncope, presyncope, or lightheadedness, I would be inclined to proceed with a pacemaker.  He is in agreement with this.  I will see him back in 6 months unless he develops symptoms.

## 2011-03-20 NOTE — Assessment & Plan Note (Signed)
Distal LAD stenosis in 2002 managed medically.  No ischemic symptoms.  No chest pain with atrial fibrillation.  EF preserved on echo.  He will continue aspirin.

## 2011-03-20 NOTE — Telephone Encounter (Signed)
Pt signed ROI, Picked up records of Holter Monitor 03/20/11/KM

## 2011-03-30 ENCOUNTER — Telehealth: Payer: Self-pay | Admitting: Cardiology

## 2011-03-30 NOTE — Telephone Encounter (Signed)
Pt wants to know if anne sent papers to [place]. Pt wants to talk to [facility]

## 2011-03-30 NOTE — Telephone Encounter (Signed)
Refaxed Records to [phone] 03/30/11/Km

## 2011-03-30 NOTE — Telephone Encounter (Signed)
Records Faxed to [facility] @ [phone] 03/30/11/KM

## 2011-04-01 ENCOUNTER — Ambulatory Visit: Payer: Medicare Other | Admitting: Cardiology

## 2011-05-07 ENCOUNTER — Encounter: Payer: Self-pay | Admitting: Internal Medicine

## 2011-05-07 ENCOUNTER — Ambulatory Visit (INDEPENDENT_AMBULATORY_CARE_PROVIDER_SITE_OTHER): Payer: Medicare Other | Admitting: Internal Medicine

## 2011-05-07 DIAGNOSIS — R109 Unspecified abdominal pain: Secondary | ICD-10-CM

## 2011-05-07 DIAGNOSIS — I2510 Atherosclerotic heart disease of native coronary artery without angina pectoris: Secondary | ICD-10-CM

## 2011-05-07 DIAGNOSIS — Z8679 Personal history of other diseases of the circulatory system: Secondary | ICD-10-CM

## 2011-05-07 DIAGNOSIS — I495 Sick sinus syndrome: Secondary | ICD-10-CM

## 2011-05-08 NOTE — Discharge Summary (Signed)
NAMEJOVANI, [name]                           ACCOUNT NO.:  [id number]   MEDICAL RECORD NO.:  [id number]                   PATIENT TYPE:  INP   LOCATION:  3734                                 FACILITY:  [facility]   PHYSICIAN:  [name], M.D.                  DATE OF BIRTH:  1922-07-22   DATE OF ADMISSION:  04/28/2004  DATE OF DISCHARGE:  04/29/2004                                 DISCHARGE SUMMARY   DISCHARGE DIAGNOSIS:  Left upper quadrant myositis.   OTHER PAST MEDICAL HISTORY:  1. Paroxysmal SVT.  2. Coronary artery disease.   DISCHARGE MEDICATIONS:  Patient was instructed to resume all home  medications, atenolol, multivitamin and aspirin.   SPECIAL INSTRUCTIONS:  The patient was told if pain were to return, to begin  Tylenol 1000 mg every 6 hours and be seen by his physician.   FOLLOWUP:  The patient states he will follow up at the [facility] within 1 month.   CONSULTS:  None.   PROCEDURES:  Abdominal CT.   BRIEF ADMISSION HISTORY:  This is a 77 year old white male with a history  of paroxysmal SVT and coronary artery disease, who presented to the  emergency department with failing outpatient narcotics for left-sided flank  pain that radiated across the abdomen.  The pain first started on Apr 24, 2004.  The patient denied a known cause, denied any previous episode.  The  patient reported the pain felt like a tightness, intermittent in nature,  worse with tensing abdominal muscles and abduction and flexion on the left  arm.  The patient was taking 2 Percocets every 4 hours without resolution of  the pain.  He denied any rash, blisters or tenderness over the skin, any  recent falls or chest wall trauma.  The patient states they are not  associated with meals and the pain extended to the left mid-axillary line.   ADMISSION LABORATORIES:  CBC:  White blood cell count 11.8, hemoglobin 13.4,  hematocrit of 39.6, platelets of 208,000, 69%  neutrophils.  Sodium 132,  potassium 4.1, chloride of 98, BUN of 13, creatinine of 1.3, glucose of 115,  bicarb of 30.7.  Myoglobin 271, CK-MB 3.1, troponin I of less than 0.05.   HOSPITAL COURSE:  PROBLEM #1 - LEFT UPPER QUADRANT FLANK PAIN:  The patient  had CT of the abdomen in the ER which showed increasing  inflammation/fullness of the left anterolateral lower chest wall abdominal  musculature, ? infection versus nonspecific inflammation, stable small  gallstone/sludge.  CT of the pelvis showed evidence of right hip  replacement, bladder and bowel grossly unremarkable, no free fluid or  enlarged lymph node.  Acute abdominal series showed no acute abnormality.  Detailed x-ray of the ribs showed no rib abnormality and stable mild chronic  interstitial lung disease.  The main things to rule out on the patient was  any type of metastatic bone disease or fracture or infectious etiology.  The  patient had a sed rate on Apr 29, 2004, which was 64, and then re-reported  as 49.  Blood cultures were obtained, 2 sets, which were no growth x5 days  at the time of dictation.  This was likely a muscle strain during physical  activity, considering the patient is very active and still works out.  The  initial plan was to monitor the patient another 24 hours; however, the  patient stated that his pain had resolved during the hospitalization and  would like to go home.  It resolved without any new intervention.  The  patient was placed on naproxen during the hospitalization.  Other medical  conditions were stable during hospitalization.  The patient did have an EKG  and serial cardiac enzymes to rule out cardiac etiology; EKG had no changes  and cardiac enzymes were within normal limits.      Dakota Auerbach, MD                          [name], M.D.    AD/MEDQ  D:  06/19/2004  T:  06/20/2004  Job:  [id number]   cc:   [name]
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
## 2011-05-08 NOTE — Discharge Summary (Signed)
NAMEJAMEE, PACHOLSKI               ACCOUNT NO.:  [id number]   MEDICAL RECORD NO.:  [id number]          PATIENT TYPE:  INP   LOCATION:  1414                         FACILITY:  [facility]   PHYSICIAN:  Rene Paci, M.D. LHCDATE OF BIRTH:  Feb 10, 1922   DATE OF ADMISSION:  06/30/2005  DATE OF DISCHARGE:                                 DISCHARGE SUMMARY   DISCHARGE DIAGNOSES:  1.  Subacute diarrhea, presumed viral, improved. Stool culture negative.  2.  Mild hepatitis, again presumed viral versus drug side effect (sulfa      antibiotic) decreasing trend. Follow up as outpatient until normalized.  3.  History of coronary disease, last catheterization April 2002. Continue      medical management.  4.  History of gastroesophageal reflux disease and peptic ulcer disease,      continue proton pump inhibitor.  5.  Chronic prednisone, unclear diagnosis and indication for treatment.      Continue same dose with outpatient follow-up to consider taper.  6.  Weakness and failure-to-thrive secondary to #1 and #2, resolved at      baseline.  7.  History of benign prostatic hypertrophy.   DISCHARGE MEDICATIONS:  The patient is been discontinued off his Bactrim. He  was instructed to avoid other sulfa drugs. Otherwise, medications are as  prior to admission and include:  1.  Prednisone 7.5 mg daily.  2.  Omeprazole 20 mg daily.  3.  Atenolol 50 mg daily.  4.  Aspirin 81 mg daily.  5.  Quinine sulfate 325 q.h.s.  6.  Flomax 0.4 mg daily.   HOSPITAL FOLLOW-UP:  Is with primary care physician Dr. Thomos Lemons for next  week - Tuesday, July 18 at 2:30 p.m.   CONSULTATIONS THIS HOSPITALIZATION:  Include [facility], Dr. Arlyce Dice.   PROCEDURES:  Include an abdominal ultrasound which was negative for acute  process, mild hepatomegaly, sludge, and small stones, common bile duct  within normal limits.   CONDITION ON DISCHARGE:  Medically improved, tolerating p.o. and plans for  discharge medications and  follow-up.   HOSPITAL COURSE BY PROBLEM:  SUBACUTE DIARRHEA WITH WEAKNESS AND FAILURE-TO-  THRIVE. The patient is an 77 year old gentleman who had approximately 2  weeks of loose stools initially explosive and unrelenting but then  persistent without much change. He was seen in the emergency room and given  empiric Sulfa but had no change in his symptoms. Because of continued  weakness and symptoms, came to primary care physician's office and was  admitted for further evaluation. He was found to have a mild hepatitis with  elevated transaminases and alk phos, Abdominal ultrasound was done and  showed no acute process but mild hepatomegaly, stones, and sludge. It was  unclear if the diarrhea and hepatitis were related to a viral syndrome,  though acute hepatitis panel was negative. CMV and EBB still pending at time  of dictation. The patient's diarrhea symptoms have improved, LFTs have had a  downward trend but have not yet normalized. GI saw the patient in  consultation and had no other recommendations but agreed that both diarrhea  as well as hepatitis were likely due to a viral syndrome versus hepatitis  from drug side effect and sulfa given for diarrhea. The patient's weakness  has resolved. He is ambulating the halls independently, tolerating p.o.  Plans for discharge home if okay with GI. An outpatient follow-up with  primary care physician next week to ensure resolution of symptoms as well as  continued downward trend and normalization of LFTs.   DISCHARGE LABORATORY DATA:  Alk phos of 237, AST 132, and ALT of 356 on July  14.       VL/MEDQ  D:  07/03/2005  T:  07/03/2005  Job:  [id number]
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
## 2011-05-08 NOTE — Cardiovascular Report (Signed)
[place]. [facility]  Patient:    [name], [name]                        MRN: [id number] Proc. Date: 03/21/01 Adm. Date:  [id number] Attending:  Farley Ly CC:         Catheter lab  Julieanne Manson, M.D.  Fransisco Hertz, M.D.   Cardiac Catheterization  PROCEDURES:                   1. Left heart catheterization.                               2. Selective right and left coronary                                  arteriography.                               3. Ventricular arteriography in the RAO                                  projection.                               4. Aortic root injection.  INDICATIONS:                  [name] is a 77 year old male who was admitted after having several months of intermittent palpitations.  Of recent, has had presyncopal episodes with these and finally had a syncopal episode while supine that prompted this admission.  He also had borderline positive cardiac enzymes and an unremarkable EKG.  COMPLICATIONS:                None.  INSTRUMENTS:                  A 6 French Judkins configuration catheters.  DESCRIPTION OF PROCEDURE:     The patient was prepped and draped in the usual sterile fashion exposing the right groin.  Applying local anesthetic with 1% Xylocaine, the Seldinger technique was employed and the 6 [place] introducer sheath was placed in the right femoral artery.  Selective right and left coronary arteriography and ventriculography in the RAO projection was performed.  The left coronary catheter appeared to almost prolapse on itself in the ascending aorta, which implied dilatation of this, and because of this, an aortic root injection was performed.  RESULTS:  Hemodynamics: 1. Central aortic pressure 145/69. 2. Left ventricular pressure 145/20. 3. There was no aortic valve gradient noted at the time of pullback.  Ventriculography:             Ventriculography in the RAO projection using  30 cc of  contrast at 12 cc per second revealed good opacification of the left ventricle.  The left ventricle was dilated.  There was normal left ventricular systolic function, +1 mitral regurgitation, ejection fraction 60%, in-diastolic pressure 25.  Aortic root injection:  Aortic root injection using 20 cc of contrast revealed the aortic root to be slightly dilated with no aneurysmal formation and no aortic insufficiency.  Coronary arteriography: 1. Calcification of the left  main and LAD was noted on fluoroscopy. 2. Left main normal. 3. LAD:  The LAD was a large vessel extending down and around the apex of    the heart, supplying the distal portion of the inferior wall.  At the apex    of the heart was a concentric area of 75-80% narrowing.  Distal to this was    a very short segment of LAD after the obstruction.  I do not feel that this    small ongoing LAD warrants intervention.  The proximal LAD at the    bifurcation of the diagonal had some mild irregularities in it and in    the mouth of the diagonal but there was no significant obstructions.  The    mid-vessel was free of disease. 4. Circumflex:  The circumflex was a large vessel with a very large OM1 and    OM2.  This system was free of significant disease. 5. Right coronary artery:  The right coronary artery was a large dominant    vessel with a PDA and three posterolateral branches, all of which were free    of disease.  CONCLUSION:                   1. Dilatation of the left ventricle with normal                                  systolic function.                               2. Mitral regurgitation +1.                               3. Concentric 75-80% obstruction in the distal                                  portion of the left anterior descending                                  coronary artery at the apex, not to felt to                                  warrant intervention because of the small                                   length of the ongoing left anterior                                  descending coronary artery.  DISCUSSION:                   At this point, the patient would be treated medically with aspirin and low dose beta blockers (Toprol 1/2 tablet 15 mg once a day).  Treatment, however, with beta blockers will probably suppress his ectopy and we will never find the source of his arrhythmia.  If the patient has no recurrent arrhythmias in the  next 24 hours, he could be discharged to home with the event recorder placed to make sure he does not have significant obstruction.  I discussed the importance of him not driving because of his syncopal episode for a period of six months. DD:  03/21/01 TD:  03/21/01 Job: [id number]

## 2011-05-10 ENCOUNTER — Encounter: Payer: Self-pay | Admitting: Internal Medicine
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
## 2011-05-12 ENCOUNTER — Ambulatory Visit (INDEPENDENT_AMBULATORY_CARE_PROVIDER_SITE_OTHER): Payer: Medicare Other | Admitting: Cardiology

## 2011-05-12 ENCOUNTER — Encounter: Payer: Self-pay | Admitting: Cardiology

## 2011-05-12 DIAGNOSIS — I2510 Atherosclerotic heart disease of native coronary artery without angina pectoris: Secondary | ICD-10-CM

## 2011-05-12 DIAGNOSIS — R0602 Shortness of breath: Secondary | ICD-10-CM

## 2011-05-12 DIAGNOSIS — I5032 Chronic diastolic (congestive) heart failure: Secondary | ICD-10-CM | POA: Insufficient documentation

## 2011-05-12 DIAGNOSIS — I4891 Unspecified atrial fibrillation: Secondary | ICD-10-CM

## 2011-05-12 DIAGNOSIS — Z8679 Personal history of other diseases of the circulatory system: Secondary | ICD-10-CM

## 2011-05-12 DIAGNOSIS — I509 Heart failure, unspecified: Secondary | ICD-10-CM

## 2011-05-12 MED ORDER — ATENOLOL 25 MG PO TABS: 25.0000 mg | ORAL_TABLET | Freq: Every day | ORAL | Status: DC

## 2011-05-12 NOTE — Assessment & Plan Note (Addendum)
Patient has paroxysmal atrial fibrillation and is in atrial fibrillation today.  He was also apparently in atrial fibrillation at appointment with Dr [name] last week.  I suspect that this is the cause of his increased fatigue.  He also appears mildly volume overloaded on exam.  The atrial fibrillation may be persistent.  Given the fact that the patient's atrial fibrillation is symptomatic, a reasonable course would be to start him on amiodarone to try to keep him in NSR and cardiovert him on amiodarone if the atrial fibrillation remained persistent.  [name] wants to try to avoid any new medications if at all possible.  Therefore, for now, I will have him increase his atenolol to 25 mg bid for better rate control.  Will need to follow for symptoms of bradycardia as he had some moderate pauses on recent holter.  His CHADSVASC score is 3 (age > 55 and CAD), so anticoagulation would be recommended.  He is steady on his feet and has no recent bleeding history so would be a reasonable candidate.  He was told by his [place] doctor that he does not need anticoagulation, so he wants to stay on ASA 325 for now. I am going to see him back in a week.  If he is still in atrial fibrillation and still more fatigued, he would be open to considering amiodarone/cardioversion. Today, will check BMET and BNP as well as TSH.

## 2011-05-12 NOTE — Assessment & Plan Note (Signed)
Patient appears mildly volume overloaded on exam with a mild increase in his chronic lower extremity edema and borderline elevated neck veins.  This mild volume retention was likely triggered by atrial fibrillation/RVR.  I have asked him to limit salt intake and keep legs elevated.  If he stays in atrial fibrillation and volume looks worse or symptoms are worse next week, I will talk to him about Lasix.

## 2011-05-12 NOTE — Assessment & Plan Note (Signed)
Known distal LAD disease.  No chest pain, no exertional dyspnea.  Patient does have increased fatigue.  I think that this probably comes from persistent atrial fibrillation rather than coronary disease. Continue ASA and atenolol.  He does not want any new meds so will hold off on statin.
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
## 2011-05-12 NOTE — Patient Instructions (Signed)
Increase Atenolol to 25mg  twice a day.  Lab today--BMP/BNP/TSH 427.31  Schedule an appointment to see Dr Shirlee Latch in 1 week.

## 2011-05-13 LAB — BASIC METABOLIC PANEL
BUN: 19 mg/dL (ref 6–23)
CO2: 27 mEq/L (ref 19–32)
Calcium: 8.8 mg/dL (ref 8.4–10.5)
Chloride: 103 mEq/L (ref 96–112)
Creatinine, Ser: 1.1 mg/dL (ref 0.4–1.5)
GFR: 67.26 mL/min (ref >60.00)
Glucose, Bld: 117 mg/dL — ABNORMAL HIGH (ref 70–99)
Potassium: 4.3 mEq/L (ref 3.5–5.1)
Sodium: 138 mEq/L (ref 135–145)

## 2011-05-20 ENCOUNTER — Ambulatory Visit (INDEPENDENT_AMBULATORY_CARE_PROVIDER_SITE_OTHER): Payer: Medicare Other | Admitting: Cardiology

## 2011-05-20 ENCOUNTER — Encounter: Payer: Self-pay | Admitting: Cardiology

## 2011-05-20 ENCOUNTER — Ambulatory Visit: Payer: Medicare Other | Admitting: Cardiology

## 2011-05-20 ENCOUNTER — Encounter: Payer: Self-pay | Admitting: *Deleted

## 2011-05-20 DIAGNOSIS — I509 Heart failure, unspecified: Secondary | ICD-10-CM

## 2011-05-20 DIAGNOSIS — Z8679 Personal history of other diseases of the circulatory system: Secondary | ICD-10-CM

## 2011-05-20 DIAGNOSIS — Z7901 Long term (current) use of anticoagulants: Secondary | ICD-10-CM | POA: Insufficient documentation

## 2011-05-20 DIAGNOSIS — I4891 Unspecified atrial fibrillation: Secondary | ICD-10-CM

## 2011-05-20 DIAGNOSIS — I495 Sick sinus syndrome: Secondary | ICD-10-CM

## 2011-05-20 DIAGNOSIS — I5032 Chronic diastolic (congestive) heart failure: Secondary | ICD-10-CM

## 2011-05-20 DIAGNOSIS — I2510 Atherosclerotic heart disease of native coronary artery without angina pectoris: Secondary | ICD-10-CM

## 2011-05-20 DIAGNOSIS — I4892 Unspecified atrial flutter: Secondary | ICD-10-CM

## 2011-05-20 MED ORDER — FUROSEMIDE 20 MG PO TABS: 20.0000 mg | ORAL_TABLET | Freq: Every day | ORAL | Status: DC

## 2011-05-20 MED ORDER — WARFARIN SODIUM 5 MG PO TABS: 5.0000 mg | ORAL_TABLET | Freq: Every day | ORAL | Status: DC

## 2011-05-20 MED ORDER — AMIODARONE HCL 200 MG PO TABS: ORAL_TABLET | ORAL | Status: DC

## 2011-05-20 MED ORDER — POTASSIUM CHLORIDE 10 MEQ PO TBCR: 10.0000 meq | EXTENDED_RELEASE_TABLET | Freq: Every day | ORAL | Status: DC

## 2011-05-20 NOTE — Assessment & Plan Note (Signed)
Patient had asymptomatic pauses up to 3.5 seconds while on diltiazem in addition to atenolol.  This was stopped.  His symptoms currently are exertional likely attributable to atrial fibrillation with RVR.  Will need to monitor very closely for further bradycardia and need for pacemaker.

## 2011-05-20 NOTE — Assessment & Plan Note (Signed)
Known distal LAD disease.  No chest pain, no exertional dyspnea.  Patient does have increased fatigue.  I think that this probably comes from persistent atrial fibrillation with development of diastolic CHF rather than coronary disease. Continue ASA (decrease to 81 mg daily) and atenolol.  I will eventually broach the issue of statin use with him but not today.

## 2011-05-20 NOTE — Progress Notes (Signed)
Addended by: Sherri Rad C on: 05/20/2011 03:01 PM   Modules accepted: Orders

## 2011-05-20 NOTE — Patient Instructions (Addendum)
Your physician has requested that you have a TEE/Cardioversion. During a TEE, sound waves are used to create images of your heart. It provides your doctor with information about the size and shape of your heart and how well your heart's chambers and valves are working. In this test, a transducer is attached to the end of a flexible tube that is guided down you throat and into your esophagus (the tube leading from your mouth to your stomach) to get a more detailed image of your heart. Once the TEE has determined that a blood clot is not present, the cardioversion begins. Electrical Cardioversion uses a jolt of electricity to your heart either through paddles or wired patches attached to your chest. This is a controlled, usually prescheduled, procedure. This procedure is done at the hospital and you are not awake during the procedure. You usually go home the day of the procedure. Please see the instruction sheet given to you today for more information.  Your physician has recommended you make the following change in your medication:  1) Start amiodarone 200mg  one tablet twice daily. 2) Decrease atenolol to 25mg  once daily. 3) Start Aspirin 81mg  once daily. 4) Start Lasix 20mg  once daily. 5) Start Potassium once daily. 6) Start Coumadin 5mg  once daily as directed.  You will need to have a coumadin clinic appointment on Friday 05/22/11 @ 10:30am.  Your physician recommends that you schedule a follow-up appointment in: 2 weeks.

## 2011-05-20 NOTE — Assessment & Plan Note (Signed)
Patient now appears to have persistent atrial fibrillation.  He has had a clear worsening in exertional symptoms since atrial fibrillation developed and is now fatigued after walking about 2 blocks.  He is volume overloaded with diastolic CHF likely triggered by persistent atrial fibrillation.  He is now amenable to treatment given worsening of symptoms.   - He needs anticoagulation: Xarelto was going to cost > $200/month, so I will start him on coumadin instead.   - I will plan a TEE-guided cardioversion next week after INR is therapeutic.  - Decrease ASA to 81 mg daily - Start amiodarone 200 mg bid to help maintain once cardioversion is done.  With initiation of amiodarone, I will decrease atenolol back to 25 mg daily.  - If symptoms worsen, he will call us and I will admit him for an inpatient workup.
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
## 2011-05-20 NOTE — Assessment & Plan Note (Signed)
Patient is volume overloaded with NYHA class II-III symptoms in the setting of new onset persistent atrial fibrillation.  BNP was elevated at recent check.  I will start Lasix 20 mg daily + KCl 10 mEq daily. BMET next week.

## 2011-05-22 ENCOUNTER — Ambulatory Visit (INDEPENDENT_AMBULATORY_CARE_PROVIDER_SITE_OTHER): Payer: Medicare Other | Admitting: *Deleted

## 2011-05-22 ENCOUNTER — Other Ambulatory Visit (INDEPENDENT_AMBULATORY_CARE_PROVIDER_SITE_OTHER): Payer: Medicare Other | Admitting: *Deleted

## 2011-05-22 DIAGNOSIS — Z8679 Personal history of other diseases of the circulatory system: Secondary | ICD-10-CM

## 2011-05-22 DIAGNOSIS — I4892 Unspecified atrial flutter: Secondary | ICD-10-CM

## 2011-05-22 DIAGNOSIS — I4891 Unspecified atrial fibrillation: Secondary | ICD-10-CM

## 2011-05-22 DIAGNOSIS — Z7901 Long term (current) use of anticoagulants: Secondary | ICD-10-CM

## 2011-05-22 LAB — BASIC METABOLIC PANEL
BUN: 18 mg/dL (ref 6–23)
Calcium: 9 mg/dL (ref 8.4–10.5)
GFR: 70.2 mL/min (ref >60.00)
Potassium: 4.6 mEq/L (ref 3.5–5.1)
Sodium: 140 mEq/L (ref 135–145)

## 2011-05-22 LAB — CBC WITH DIFFERENTIAL/PLATELET
Basophils Absolute: 0 10*3/uL (ref 0.0–0.1)
Eosinophils Relative: 3.7 % (ref 0.0–5.0)
HCT: 37.3 % — ABNORMAL LOW (ref 39.0–52.0)
Lymphocytes Relative: 28.5 % (ref 12.0–46.0)
Lymphs Abs: 1.5 10*3/uL (ref 0.7–4.0)
Monocytes Relative: 13.4 % — ABNORMAL HIGH (ref 3.0–12.0)
Platelets: 175 10*3/uL (ref 150.0–400.0)
WBC: 5.1 10*3/uL (ref 4.5–10.5)

## 2011-05-27 ENCOUNTER — Ambulatory Visit (HOSPITAL_COMMUNITY)
Admission: RE | Admit: 2011-05-27 | Discharge: 2011-05-27 | Disposition: A | Discharge status: Home / Self Care | Payer: Medicare Other | Source: Ambulatory Visit | Attending: Cardiovascular Disease | Admitting: Cardiovascular Disease

## 2011-05-27 ENCOUNTER — Ambulatory Visit (INDEPENDENT_AMBULATORY_CARE_PROVIDER_SITE_OTHER): Payer: Medicare Other | Admitting: *Deleted

## 2011-05-27 DIAGNOSIS — I5030 Unspecified diastolic (congestive) heart failure: Secondary | ICD-10-CM | POA: Insufficient documentation

## 2011-05-27 DIAGNOSIS — Z8711 Personal history of peptic ulcer disease: Secondary | ICD-10-CM | POA: Insufficient documentation

## 2011-05-27 DIAGNOSIS — K219 Gastro-esophageal reflux disease without esophagitis: Secondary | ICD-10-CM | POA: Insufficient documentation

## 2011-05-27 DIAGNOSIS — I4891 Unspecified atrial fibrillation: Secondary | ICD-10-CM | POA: Insufficient documentation

## 2011-05-27 DIAGNOSIS — I2510 Atherosclerotic heart disease of native coronary artery without angina pectoris: Secondary | ICD-10-CM | POA: Insufficient documentation

## 2011-05-27 DIAGNOSIS — I4892 Unspecified atrial flutter: Secondary | ICD-10-CM

## 2011-05-27 DIAGNOSIS — I509 Heart failure, unspecified: Secondary | ICD-10-CM | POA: Insufficient documentation

## 2011-05-27 DIAGNOSIS — N400 Enlarged prostate without lower urinary tract symptoms: Secondary | ICD-10-CM | POA: Insufficient documentation

## 2011-05-27 DIAGNOSIS — Z7901 Long term (current) use of anticoagulants: Secondary | ICD-10-CM

## 2011-05-27 DIAGNOSIS — Z8679 Personal history of other diseases of the circulatory system: Secondary | ICD-10-CM

## 2011-05-31 ENCOUNTER — Telehealth: Payer: Self-pay | Admitting: Nurse Practitioner

## 2011-05-31 NOTE — Telephone Encounter (Signed)
Pt called stating that he is recently s/p dccv for a. Fib.  He's on chronic coumadin.  Over past 24hrs, he has had recurrent fatigue and dyspnea which he attributes to a.fib.  He denies c/p or palps, but has never had palps in past either.  He feels stable, just run down.  i rec. That if Ss worsen than he should present to the ED.  Otw, call office Monday for appt.  He has an appt. On Wednesday but thinks that he'd like to be seen on Monday if possible.

## 2011-06-01 ENCOUNTER — Telehealth: Payer: Self-pay | Admitting: Cardiology

## 2011-06-01 ENCOUNTER — Ambulatory Visit (INDEPENDENT_AMBULATORY_CARE_PROVIDER_SITE_OTHER): Payer: Medicare Other | Admitting: *Deleted

## 2011-06-01 ENCOUNTER — Encounter: Payer: Self-pay | Admitting: Cardiology

## 2011-06-01 ENCOUNTER — Encounter: Payer: Self-pay | Admitting: *Deleted

## 2011-06-01 ENCOUNTER — Ambulatory Visit (INDEPENDENT_AMBULATORY_CARE_PROVIDER_SITE_OTHER): Payer: Medicare Other | Admitting: Cardiology

## 2011-06-01 VITALS — BP 112/70 | HR 79 | Resp 16 | Ht 72.0 in | Wt 200.0 lb

## 2011-06-01 DIAGNOSIS — I509 Heart failure, unspecified: Secondary | ICD-10-CM

## 2011-06-01 DIAGNOSIS — Z8679 Personal history of other diseases of the circulatory system: Secondary | ICD-10-CM

## 2011-06-01 DIAGNOSIS — I4891 Unspecified atrial fibrillation: Secondary | ICD-10-CM | POA: Insufficient documentation

## 2011-06-01 DIAGNOSIS — I4892 Unspecified atrial flutter: Secondary | ICD-10-CM

## 2011-06-01 DIAGNOSIS — I2510 Atherosclerotic heart disease of native coronary artery without angina pectoris: Secondary | ICD-10-CM

## 2011-06-01 DIAGNOSIS — Z7901 Long term (current) use of anticoagulants: Secondary | ICD-10-CM

## 2011-06-01 DIAGNOSIS — I5032 Chronic diastolic (congestive) heart failure: Secondary | ICD-10-CM

## 2011-06-01 LAB — POCT INR: INR: 4.3

## 2011-06-01 MED ORDER — FUROSEMIDE 20 MG PO TABS: 20.0000 mg | ORAL_TABLET | Freq: Every day | ORAL | Status: DC

## 2011-06-01 MED ORDER — AMIODARONE HCL 200 MG PO TABS: 200.0000 mg | ORAL_TABLET | Freq: Three times a day (TID) | ORAL | Status: DC

## 2011-06-01 MED ORDER — POTASSIUM CHLORIDE 10 MEQ PO TBCR: 10.0000 meq | EXTENDED_RELEASE_TABLET | Freq: Every day | ORAL | Status: DC

## 2011-06-01 MED ORDER — AMIODARONE HCL 200 MG PO TABS: ORAL_TABLET | ORAL | Status: DC

## 2011-06-01 NOTE — Consult Note (Signed)
  NAMELYN, DEEMER               ACCOUNT NO.:  [id number]  MEDICAL RECORD NO.:  [id number]  LOCATION:  [facility]                         FACILITY:  [facility]  PHYSICIAN:  Noralyn Pick. Eden Emms, MD, FACCDATE OF BIRTH:  September 13, 1922  DATE OF CONSULTATION:  05/27/2011 DATE OF DISCHARGE:                                TEE/DCC   An 77 year old [name] with symptomatic atrial fibrillation.  The [name] has been on therapeutic Coumadin for about a week.  His INR today was 2.8.  The cardioversion was indicated for symptomatic improvement in the [name]'s shortness of breath and fatigue.  The [name] was sedated with 5 mg of Versed and 25 mics fentanyl using digital technique and Omniplane probe was advanced into esophagus without incident.  There was severe left atrial enlargement.  There was no spontaneous contrast.  No left atrial appendage thrombus.  Left atrial appendage was well visualized in orthogonal planes.  Mitral valve was mildly thickened with mild-to-moderate MR.  Left ventricle had mild hypertrophy with an EF of 55%.  Atrial septum was slightly redundant, but intact.  There was mild-to-moderate right ventricular enlargement with mild right atrial enlargement.  The aortic valve was trileaflet with moderate sclerosis, but no stenosis.  Aortic root was normal. Imaging of the aorta showed no significant debris.  Since we had good sedation, I elected to cardiovert the [name] with a single 200 joules biphasic shock.  He converted to normal sinus rhythm in apparent long PR interval.  IMPRESSION:  Successful transesophageal echocardiography-guided cardioversion on therapeutic Coumadin.  In the first 10 minutes after the procedure, he did have a short burst of PAF.  He was on oral amiodarone and I elected to give him 150 mg IV bolus dose of amiodarone. He will have a postop EKG to further assess his PR interval.  He has a followup appointment with our Coumadin Clinic and Dr. Shirlee Latch.  He  tolerated the procedure well with no immediate neurological sequela.     Noralyn Pick. Eden Emms, MD, [facility]     PCN/MEDQ  D:  05/27/2011  T:  05/28/2011  Job:  [id number]  cc:   Dr. Shirlee Latch  Electronically Signed by Charlton Haws MD [facility] on 06/01/2011 11:17:44 AM

## 2011-06-01 NOTE — Assessment & Plan Note (Signed)
I reveiwed his case today with Dr. Shirlee Latch.  I will increase the amiodarone to 400 tid.  His INR was above 4 today.  He will come back Wed for a DCCV.  TEE is not needed this time.  His daughter will come back for [place] today to stay with him as he is week.  His Coumadin was adjusted by Weston Brass PharmD.

## 2011-06-01 NOTE — Assessment & Plan Note (Signed)
Mr. Jennifer Holland seems to be euvolemic.  At this point, no change in therapy is indicated.  We have reviewed salt and fluid restrictions.  No further cardiovascular testing is indicated.

## 2011-06-01 NOTE — Telephone Encounter (Signed)
Per pt calling, since cardioversion on last Wednesday.  Pt back in same problem as before, loss balances .

## 2011-06-01 NOTE — Assessment & Plan Note (Signed)
This was nonobstructive and not contributing to current symptoms.  He will continue the meds as listed.
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
## 2011-06-01 NOTE — Patient Instructions (Addendum)
You have been scheduled for a outpatient cardioversion on Wednesday June 03, 2011 with Dr Shirlee Latch. Increase your amiodarone to 200 mg 2 tablets three times a day. Restart your Furosemide and potassium chloride as directed Continue all other medications as listed.

## 2011-06-01 NOTE — Telephone Encounter (Signed)
I spoke with the pt and he had a DCCV last Wednesday.  The pt feels like the procedure worked for a couple of days but he developed symptoms again on Saturday.  The pt did speak with Ward Givens NP on Sunday about symptoms and recommended that he come into the ER or contact our office today for an appointment. The pt does have an appointment on Wednesday with Dr Shirlee Latch but would like to be seen today because of his symptoms.  I arranged for the pt to be seen by Dr Antoine Poche (DOD) today at 3:45.

## 2011-06-02 ENCOUNTER — Telehealth: Payer: Self-pay | Admitting: Cardiology

## 2011-06-02 DIAGNOSIS — I5032 Chronic diastolic (congestive) heart failure: Secondary | ICD-10-CM

## 2011-06-02 DIAGNOSIS — I4891 Unspecified atrial fibrillation: Secondary | ICD-10-CM

## 2011-06-02 MED ORDER — AMIODARONE HCL 200 MG PO TABS: ORAL_TABLET | ORAL | Status: DC

## 2011-06-02 NOTE — Telephone Encounter (Signed)
Pharmacist states he received four prescription for amiodarone.  Pharmacist wants to know which one is the correct one. Pharmacist would like to speak with dr hochrien or his nurse.

## 2011-06-02 NOTE — Telephone Encounter (Signed)
Per pharmacist - they already had been called on this

## 2011-06-03 ENCOUNTER — Ambulatory Visit: Payer: Medicare Other | Admitting: Cardiology

## 2011-06-03 ENCOUNTER — Encounter: Payer: Medicare Other | Admitting: *Deleted

## 2011-06-03 ENCOUNTER — Ambulatory Visit (HOSPITAL_COMMUNITY)
Admission: RE | Admit: 2011-06-03 | Discharge: 2011-06-03 | Disposition: A | Discharge status: Home / Self Care | Payer: Medicare Other | Source: Ambulatory Visit | Attending: Cardiology | Admitting: Cardiology

## 2011-06-03 DIAGNOSIS — Z01812 Encounter for preprocedural laboratory examination: Secondary | ICD-10-CM | POA: Insufficient documentation

## 2011-06-03 DIAGNOSIS — I4891 Unspecified atrial fibrillation: Secondary | ICD-10-CM

## 2011-06-03 DIAGNOSIS — Z01810 Encounter for preprocedural cardiovascular examination: Secondary | ICD-10-CM | POA: Insufficient documentation

## 2011-06-03 DIAGNOSIS — Z79899 Other long term (current) drug therapy: Secondary | ICD-10-CM | POA: Insufficient documentation

## 2011-06-03 LAB — BASIC METABOLIC PANEL
CO2: 26 mEq/L (ref 19–32)
Calcium: 8.5 mg/dL (ref 8.4–10.5)
Creatinine, Ser: 1.04 mg/dL (ref 0.4–1.5)
GFR calc Af Amer: >60 mL/min (ref >60)

## 2011-06-03 LAB — CBC
MCH: 29.2 pg (ref 26.0–34.0)
MCV: 86.4 fL (ref 78.0–100.0)
Platelets: 167 10*3/uL (ref 150–400)
RDW: 14.1 % (ref 11.5–15.5)

## 2011-06-04 ENCOUNTER — Telehealth: Payer: Self-pay | Admitting: Cardiology

## 2011-06-04 NOTE — Telephone Encounter (Signed)
Patient had cardioversion yesterday and was converted to NSR. He just feels weak and tired. Advised him that this is probably normal especially since he had two cardioversion's this week. He does not feel that his heart is out of rhythm. No CP or SOB. I informed him that he could come into the office for an EKG to check his rhythm and HR. He is going to call us back tomorrow to let us know how he is feeling. Will f/u with Dr. Shirlee Latch 6/25 @ 10:00 am.

## 2011-06-04 NOTE — Telephone Encounter (Signed)
Pt had cardioversion yesterday, we called to set up a 2 week fu appt, he feels he needs to be seen sooner because he's still weak-pls advise

## 2011-06-08 ENCOUNTER — Ambulatory Visit (INDEPENDENT_AMBULATORY_CARE_PROVIDER_SITE_OTHER): Payer: Medicare Other

## 2011-06-08 ENCOUNTER — Telehealth: Payer: Self-pay | Admitting: *Deleted

## 2011-06-08 ENCOUNTER — Ambulatory Visit: Payer: Medicare Other | Admitting: *Deleted

## 2011-06-08 ENCOUNTER — Ambulatory Visit (INDEPENDENT_AMBULATORY_CARE_PROVIDER_SITE_OTHER): Payer: Medicare Other | Admitting: *Deleted

## 2011-06-08 VITALS — BP 124/64 | HR 68

## 2011-06-08 DIAGNOSIS — I4891 Unspecified atrial fibrillation: Secondary | ICD-10-CM

## 2011-06-08 DIAGNOSIS — Z7901 Long term (current) use of anticoagulants: Secondary | ICD-10-CM

## 2011-06-08 DIAGNOSIS — I4892 Unspecified atrial flutter: Secondary | ICD-10-CM

## 2011-06-08 DIAGNOSIS — Z8679 Personal history of other diseases of the circulatory system: Secondary | ICD-10-CM

## 2011-06-08 LAB — BASIC METABOLIC PANEL
Calcium: 8.3 mg/dL — ABNORMAL LOW (ref 8.4–10.5)
Creatinine, Ser: 1.1 mg/dL (ref 0.4–1.5)
Sodium: 138 mEq/L (ref 135–145)

## 2011-06-08 LAB — CBC WITH DIFFERENTIAL/PLATELET
Basophils Relative: 0.6 % (ref 0.0–3.0)
Eosinophils Relative: 1.9 % (ref 0.0–5.0)
Hemoglobin: 12 g/dL — ABNORMAL LOW (ref 13.0–17.0)
Lymphocytes Relative: 15.1 % (ref 12.0–46.0)
Monocytes Relative: 15.3 % — ABNORMAL HIGH (ref 3.0–12.0)
Neutro Abs: 3.8 10*3/uL (ref 1.4–7.7)
Neutrophils Relative %: 67.1 % (ref 43.0–77.0)
RBC: 3.93 Mil/uL — ABNORMAL LOW (ref 4.22–5.81)
WBC: 5.7 10*3/uL (ref 4.5–10.5)

## 2011-06-08 LAB — POCT INR: INR: 7

## 2011-06-08 LAB — PROTIME-INR
INR: 10.3 ratio (ref 0.8–1.0)
Prothrombin Time: 90.8 s (ref 10.2–12.4)

## 2011-06-08 MED ORDER — PHYTONADIONE 5 MG PO TABS: 2.5000 mg | ORAL_TABLET | Freq: Once | ORAL | Status: DC

## 2011-06-08 NOTE — Patient Instructions (Addendum)
Decrease Amiodarone to 200mg  daily.   DO NOT drive. Call 911 if any symptoms or falls.  Appt with Dr Shirlee Latch Tuesday June 19,2012 at 10:45am.

## 2011-06-08 NOTE — Telephone Encounter (Signed)
See nurse room note from 06/08/11

## 2011-06-08 NOTE — Progress Notes (Signed)
Pt to office today for pro-time. Pt c/o being off-balance since DCCV  06/03/11. EKG done and reviewed by Dr Johney Frame. NSR with 1st degree block. Prolonged QT, heart rate 68 . Dr Johney Frame recommended pt decrease Amiodarone to 200mg  daily. (Pt had been on Amiodarone 400mg  bid since Friday 06/05/11. ) Dr Johney Frame ordered BMP/CBC today. Dr Johney Frame advised pt not to drive and call [phone] if any more symptoms or  falls. I have rescheduled appt to 06/09/11 at 10:45am with Dr Shirlee Latch. Pt verbalized understanding.

## 2011-06-08 NOTE — Telephone Encounter (Signed)
Pt to office 06/08/11 for protime. See nurse room note 06/08/11.

## 2011-06-09 ENCOUNTER — Encounter: Payer: Self-pay | Admitting: Cardiology

## 2011-06-09 ENCOUNTER — Ambulatory Visit (INDEPENDENT_AMBULATORY_CARE_PROVIDER_SITE_OTHER): Payer: Medicare Other | Admitting: Cardiology

## 2011-06-09 DIAGNOSIS — I4891 Unspecified atrial fibrillation: Secondary | ICD-10-CM

## 2011-06-09 DIAGNOSIS — I5032 Chronic diastolic (congestive) heart failure: Secondary | ICD-10-CM

## 2011-06-09 DIAGNOSIS — I509 Heart failure, unspecified: Secondary | ICD-10-CM

## 2011-06-09 DIAGNOSIS — R001 Bradycardia, unspecified: Secondary | ICD-10-CM

## 2011-06-09 DIAGNOSIS — I498 Other specified cardiac arrhythmias: Secondary | ICD-10-CM

## 2011-06-09 NOTE — Patient Instructions (Signed)
Stop Atenolol.  Decrease Amiodarone to 200mg  daily.  Decrease Lasix and KCL(potassium) to every other day.  Schedule an appointment for lab on Thursday June 21--Liver profile/TSH  427.31  Schedule an appointment for a 24 hour monitor on Thursday June 21.   Keep the appointment you already have scheduled with Dr Shirlee Latch Monday June 25 at 10am.
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
## 2011-06-10 DIAGNOSIS — R001 Bradycardia, unspecified: Secondary | ICD-10-CM | POA: Insufficient documentation

## 2011-06-10 NOTE — Assessment & Plan Note (Addendum)
Diastolic CHF in setting of new atrial fibrillation.  Leg swelling is down a fair amount with diuresis, as are neck veins.  I think it would be ok for him to decrease Lasix and KCl to every other day rather than daily. [name]

## 2011-06-10 NOTE — Assessment & Plan Note (Signed)
Paroxysmal atrial fibrillation.  Patient is maintaining NSR after reloading amiodarone. He still feels fatigued with exertion despite resolution of atrial fibrillation (felt great last time he was cardioverted).  He also is having some problems with balance (not always but more than in the past).  It is possible that his symptoms originate from the higher load of amiodarone.  High dose amiodarone can certainly cause balance problems and can make one feel generally sick.  Would also consider symptomatic bradycardia.  - Decrease amiodarone down to 200 mg daily - Discontinue atenolol for now with fatigue and ? Bradycardia.  - Will check LFTs and TSH (on amiodarone) when the patient returns for followup INR.

## 2011-06-10 NOTE — Assessment & Plan Note (Signed)
Heart rate 52 today.  Cannot rule out bradycardia as playing a role in his fatigue. I will get a 24 hour holter monitor to assess for any significant pauses or bradycardia.

## 2011-06-11 ENCOUNTER — Ambulatory Visit (INDEPENDENT_AMBULATORY_CARE_PROVIDER_SITE_OTHER): Payer: Medicare Other | Admitting: *Deleted

## 2011-06-11 ENCOUNTER — Encounter (INDEPENDENT_AMBULATORY_CARE_PROVIDER_SITE_OTHER): Payer: Medicare Other

## 2011-06-11 ENCOUNTER — Other Ambulatory Visit (INDEPENDENT_AMBULATORY_CARE_PROVIDER_SITE_OTHER): Payer: Medicare Other | Admitting: *Deleted

## 2011-06-11 DIAGNOSIS — I4891 Unspecified atrial fibrillation: Secondary | ICD-10-CM

## 2011-06-11 DIAGNOSIS — Z7901 Long term (current) use of anticoagulants: Secondary | ICD-10-CM

## 2011-06-11 DIAGNOSIS — I5032 Chronic diastolic (congestive) heart failure: Secondary | ICD-10-CM

## 2011-06-11 DIAGNOSIS — Z8679 Personal history of other diseases of the circulatory system: Secondary | ICD-10-CM

## 2011-06-11 DIAGNOSIS — I4892 Unspecified atrial flutter: Secondary | ICD-10-CM

## 2011-06-11 DIAGNOSIS — I509 Heart failure, unspecified: Secondary | ICD-10-CM

## 2011-06-11 LAB — HEPATIC FUNCTION PANEL
ALT: 21 U/L (ref 0–53)
AST: 18 U/L (ref 0–37)
Albumin: 3.3 g/dL — ABNORMAL LOW (ref 3.5–5.2)
Alkaline Phosphatase: 84 U/L (ref 39–117)
Total Protein: 6.2 g/dL (ref 6.0–8.3)

## 2011-06-15 ENCOUNTER — Ambulatory Visit (INDEPENDENT_AMBULATORY_CARE_PROVIDER_SITE_OTHER): Payer: Medicare Other | Admitting: Cardiology

## 2011-06-15 ENCOUNTER — Encounter: Payer: Self-pay | Admitting: Cardiology

## 2011-06-15 ENCOUNTER — Ambulatory Visit (INDEPENDENT_AMBULATORY_CARE_PROVIDER_SITE_OTHER): Payer: Medicare Other | Admitting: *Deleted

## 2011-06-15 DIAGNOSIS — I5032 Chronic diastolic (congestive) heart failure: Secondary | ICD-10-CM

## 2011-06-15 DIAGNOSIS — Z7901 Long term (current) use of anticoagulants: Secondary | ICD-10-CM

## 2011-06-15 DIAGNOSIS — I4892 Unspecified atrial flutter: Secondary | ICD-10-CM

## 2011-06-15 DIAGNOSIS — Z79899 Other long term (current) drug therapy: Secondary | ICD-10-CM

## 2011-06-15 DIAGNOSIS — I2510 Atherosclerotic heart disease of native coronary artery without angina pectoris: Secondary | ICD-10-CM

## 2011-06-15 DIAGNOSIS — Z8679 Personal history of other diseases of the circulatory system: Secondary | ICD-10-CM

## 2011-06-15 DIAGNOSIS — R001 Bradycardia, unspecified: Secondary | ICD-10-CM

## 2011-06-15 DIAGNOSIS — I498 Other specified cardiac arrhythmias: Secondary | ICD-10-CM

## 2011-06-15 DIAGNOSIS — I4891 Unspecified atrial fibrillation: Secondary | ICD-10-CM

## 2011-06-15 MED ORDER — WARFARIN SODIUM 2.5 MG PO TABS: ORAL_TABLET | ORAL | Status: DC

## 2011-06-15 NOTE — Assessment & Plan Note (Signed)
Volume status appears better, NYHA class II symptoms.  Continue current dose of Lasix.  Needs BMET.
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
## 2011-06-15 NOTE — Patient Instructions (Signed)
Your physician recommends that you schedule a follow-up appointment in: 1  MONTH WITH DR [facility]  Your physician recommends that you continue on your current medications as directed. Please refer to the Current Medication list given to you today.  Your physician recommends that you return for lab work in: 2 WEEKS BMET  427.31 [id number]  Your physician has recommended that you have a pulmonary function test. Pulmonary Function Tests are a group of tests that measure how well air moves in and out of your lungs. DX V58.69  PT'S CONVENIENCE

## 2011-06-15 NOTE — Assessment & Plan Note (Signed)
Known distal LAD disease.  No chest pain, no exertional dyspnea.  I think symptoms are likely due to atrial fibrillation rather than ischemia.  I will have him stop ASA as he is on coumadin and with stable CAD, there is no evidence that the combination of ASA/coumadin is superior to coumadin alone.

## 2011-06-15 NOTE — Assessment & Plan Note (Signed)
Patient seems to be remaining in NSR on amiodarone.  After cutting back on amiodarone and stopping atenolol, HR is higher and patient is starting to feel better.   - continue warfarin and amiodarone 200 mg daily.  - Baseline PFTs on amiodarone.  LFTs/TSH were ok recently.  Needs to see eye doctor yearly while on amiodarone.

## 2011-06-15 NOTE — Assessment & Plan Note (Signed)
No clinically significant bradycardia on holter after stopping atenolol and decreasing amiodarone.

## 2011-06-29 ENCOUNTER — Ambulatory Visit (INDEPENDENT_AMBULATORY_CARE_PROVIDER_SITE_OTHER): Payer: Medicare Other | Admitting: Internal Medicine

## 2011-06-29 ENCOUNTER — Other Ambulatory Visit (INDEPENDENT_AMBULATORY_CARE_PROVIDER_SITE_OTHER): Payer: Medicare Other | Admitting: *Deleted

## 2011-06-29 ENCOUNTER — Ambulatory Visit (INDEPENDENT_AMBULATORY_CARE_PROVIDER_SITE_OTHER): Payer: Medicare Other | Admitting: *Deleted

## 2011-06-29 DIAGNOSIS — Z7901 Long term (current) use of anticoagulants: Secondary | ICD-10-CM

## 2011-06-29 DIAGNOSIS — Z8679 Personal history of other diseases of the circulatory system: Secondary | ICD-10-CM

## 2011-06-29 DIAGNOSIS — I4892 Unspecified atrial flutter: Secondary | ICD-10-CM

## 2011-06-29 DIAGNOSIS — Z79899 Other long term (current) drug therapy: Secondary | ICD-10-CM

## 2011-06-29 DIAGNOSIS — I4891 Unspecified atrial fibrillation: Secondary | ICD-10-CM

## 2011-06-29 LAB — PULMONARY FUNCTION TEST

## 2011-06-29 LAB — BASIC METABOLIC PANEL
Chloride: 102 mEq/L (ref 96–112)
GFR: 67.95 mL/min (ref >60.00)
Glucose, Bld: 92 mg/dL (ref 70–99)
Potassium: 4.1 mEq/L (ref 3.5–5.1)
Sodium: 140 mEq/L (ref 135–145)

## 2011-06-29 NOTE — Progress Notes (Signed)
PFT done today. 

## 2011-07-06 ENCOUNTER — Telehealth: Payer: Self-pay | Admitting: *Deleted

## 2011-07-06 NOTE — Telephone Encounter (Signed)
Dr  Shirlee Latch received and reviewed PFT results. Pt aware results were normal. PFT report signed by Dr Shirlee Latch forwarded to HIM.

## 2011-07-09 NOTE — Cardiovascular Report (Signed)
  [name], CASTRILLON NO.:  [id number]  MEDICAL RECORD NO.:  [id number]  LOCATION:  [facility]                         FACILITY:  [facility]  PHYSICIAN:  Marca Ancona, MD      DATE OF BIRTH:  1922/03/09  DATE OF PROCEDURE:  06/03/2011 DATE OF DISCHARGE:  06/03/2011                           CARDIAC CATHETERIZATION   PROCEDURE:  Direct current cardioversion.  INDICATIONS:  This is an 77 year old with paroxysmal atrial fibrillation who underwent TEE cardioversion about a week ago.  Over the weekend, he went back into atrial fibrillation.  Since his TEE, he has had therapeutic INR.  Last INR was 4.3.  The patient is significantly symptomatic in atrial fibrillation.  PROCEDURE NOTE:  After consent informed was obtained, the patient was sedated by Anesthesiology using 80 mg of IV propofol.  The patient underwent direct current cardioversion using a biphasic waveform at 200 joules with conversion to normal sinus rhythm with a first-degree AV block.  His PR interval was 384 milliseconds on EKG.  There were no complications.  I am going to have the patient cut his atenolol in half to 12.5 mg a day.  He will continue amiodarone 400 mg 3 times a day for today and tomorrow. After that, he will decreased it to 400 mg twice a day for 4 days and then back to 200 mg daily after that.     Marca Ancona, MD     DM/MEDQ  D:  06/03/2011  T:  06/04/2011  Job:  [id number]  cc:   Rosalyn Gess. [name], MD  Electronically Signed by Marca Ancona MD on 07/09/2011 12:03:43 PM

## 2011-07-15 ENCOUNTER — Ambulatory Visit: Payer: Medicare Other | Admitting: Cardiology

## 2011-07-15 ENCOUNTER — Encounter: Payer: Medicare Other | Admitting: *Deleted

## 2011-07-20 ENCOUNTER — Encounter: Payer: Self-pay | Admitting: Cardiology

## 2011-07-24 ENCOUNTER — Encounter: Payer: Self-pay | Admitting: Cardiology

## 2011-07-24 ENCOUNTER — Ambulatory Visit (INDEPENDENT_AMBULATORY_CARE_PROVIDER_SITE_OTHER): Payer: Medicare Other | Admitting: *Deleted

## 2011-07-24 ENCOUNTER — Ambulatory Visit (INDEPENDENT_AMBULATORY_CARE_PROVIDER_SITE_OTHER): Payer: Medicare Other | Admitting: Cardiology

## 2011-07-24 VITALS — BP 134/66 | HR 70 | Resp 18 | Ht 72.0 in | Wt 190.8 lb

## 2011-07-24 DIAGNOSIS — I498 Other specified cardiac arrhythmias: Secondary | ICD-10-CM

## 2011-07-24 DIAGNOSIS — Z7901 Long term (current) use of anticoagulants: Secondary | ICD-10-CM

## 2011-07-24 DIAGNOSIS — I509 Heart failure, unspecified: Secondary | ICD-10-CM

## 2011-07-24 DIAGNOSIS — I4892 Unspecified atrial flutter: Secondary | ICD-10-CM

## 2011-07-24 DIAGNOSIS — Z8679 Personal history of other diseases of the circulatory system: Secondary | ICD-10-CM

## 2011-07-24 DIAGNOSIS — I5032 Chronic diastolic (congestive) heart failure: Secondary | ICD-10-CM

## 2011-07-24 DIAGNOSIS — I4891 Unspecified atrial fibrillation: Secondary | ICD-10-CM

## 2011-07-24 DIAGNOSIS — R001 Bradycardia, unspecified: Secondary | ICD-10-CM

## 2011-07-24 NOTE — Patient Instructions (Signed)
Schedule an appointment with Dr Shirlee Latch in 2 months.
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
## 2011-07-27 NOTE — Assessment & Plan Note (Signed)
No clinically significant bradycardia on holter after stopping atenolol and decreasing amiodarone.

## 2011-07-27 NOTE — Assessment & Plan Note (Signed)
Patient seems to be remaining in NSR on amiodarone.  After cutting back on amiodarone and stopping atenolol, HR is higher and patient is starting to feel better.   - continue warfarin and amiodarone 200 mg daily.  - Baseline PFTs on amiodarone were normal.  LFTs/TSH were ok recently.  Needs to see eye doctor yearly while on amiodarone.  He has a mild tremor that may be related to amiodarone.  Will need to follow.

## 2011-07-27 NOTE — Assessment & Plan Note (Signed)
Volume status appears better, NYHA class II symptoms.  Continue current dose of Lasix every other day.

## 2011-07-28 ENCOUNTER — Ambulatory Visit (INDEPENDENT_AMBULATORY_CARE_PROVIDER_SITE_OTHER)
Admission: RE | Admit: 2011-07-28 | Discharge: 2011-07-28 | Disposition: A | Discharge status: Home / Self Care | Payer: Medicare Other | Source: Ambulatory Visit | Attending: Internal Medicine | Admitting: Internal Medicine

## 2011-07-28 ENCOUNTER — Encounter: Payer: Self-pay | Admitting: Internal Medicine

## 2011-07-28 ENCOUNTER — Ambulatory Visit (INDEPENDENT_AMBULATORY_CARE_PROVIDER_SITE_OTHER): Payer: Medicare Other | Admitting: Internal Medicine

## 2011-07-28 VITALS — BP 112/64 | HR 69 | Temp 98.4°F | Wt 191.0 lb

## 2011-07-28 DIAGNOSIS — R140 Abdominal distension (gaseous): Secondary | ICD-10-CM

## 2011-07-28 DIAGNOSIS — R1900 Intra-abdominal and pelvic swelling, mass and lump, unspecified site: Secondary | ICD-10-CM

## 2011-07-28 DIAGNOSIS — R109 Unspecified abdominal pain: Secondary | ICD-10-CM

## 2011-07-28 DIAGNOSIS — R141 Gas pain: Secondary | ICD-10-CM

## 2011-07-29 NOTE — Progress Notes (Signed)
  Subjective:    Patient ID: [name], male    DOB: 05-10-1922, 77 y.o.   MRN: [id number]  HPI Mr. Study is well known to the practice. He has been followed for a. Fib by the cardiology service and has been doing well.  He presents today due to protruberance and firmness to the abdomen with increased presence of umbilical hernia. He denies any pain, weight loss, night sweats, change in bowel habit or change in level of activity.   I have reviewed the patient's medical history in detail and updated the computerized patient record.    Review of Systems System review is negative for any constitutional, cardiac, pulmonary, GI or neuro symptoms or complaints     Objective:   Physical Exam Vitals noted with very well controlled BP, no fever Gen'l - older white man who looks younger than his 19 years.  Abdomen - in the standing position he has mild protruberance of the abdomen and the umbilical hernia. There is a increased firmness to palpation. Supine there is mild tenderness with deep palpation when he is able to relax his abdominal wall musculature but no palpable mass.       Assessment & Plan:  Abdominal discomfort - no systemic symptoms or obvious paraneoplastic symptoms.  Plan - 2 view abdomen.  Clinical Data: Abdominal distension for 3 weeks.  ABDOMEN - 2 VIEW  Comparison: Acute abdominal series 06/30/2005.  Findings: There is moderate stool throughout the colon. The bowel  gas pattern is nonobstructive. There is no free intraperitoneal  air or suspicious calcification. A probable mild anterior  compression deformity at L1 appears unchanged. There are stable  degenerative changes of the left hip status post right total hip  arthroplasty.  IMPRESSION:  Moderate stool throughout the colon suggesting possible  constipation. No evidence of bowel obstruction or acute process.  Original Report Authenticated By: Gerrianne Scale, M.D.  Next step - CT abdomen/pelvis to r/o  mass.

## 2011-07-31 ENCOUNTER — Ambulatory Visit (INDEPENDENT_AMBULATORY_CARE_PROVIDER_SITE_OTHER)
Admission: RE | Admit: 2011-07-31 | Discharge: 2011-07-31 | Disposition: A | Discharge status: Home / Self Care | Payer: Medicare Other | Source: Ambulatory Visit | Attending: Internal Medicine | Admitting: Internal Medicine

## 2011-07-31 DIAGNOSIS — R143 Flatulence: Secondary | ICD-10-CM

## 2011-07-31 DIAGNOSIS — R140 Abdominal distension (gaseous): Secondary | ICD-10-CM

## 2011-07-31 DIAGNOSIS — R1900 Intra-abdominal and pelvic swelling, mass and lump, unspecified site: Secondary | ICD-10-CM

## 2011-07-31 MED ORDER — IOHEXOL 300 MG/ML  SOLN
100.0000 mL | Freq: Once | INTRAMUSCULAR | Status: AC | PRN
  Administered 2011-07-31: 100 mL via INTRAVENOUS

## 2011-08-04 ENCOUNTER — Telehealth: Payer: Self-pay | Admitting: Internal Medicine

## 2011-08-04 NOTE — Telephone Encounter (Signed)
Called patient Aug 13th to review his CT scan which revealed wide spread adenopathy, possible lytic bone lesions, asymmetric prostate, thickened bladder wall. Radiology differential: prostate cancer with mets vs possible lymphoma. This was all explained to the patient. He has clearly stated, after this full explanation, that he does not want to pursue further evaluation and does not want to consider any treatment. He states he is 49, has had a good life and is willing to let "nature run it's course." He is offered support for his decision and assurance that he will be provided on-going care including palliative care when appropriate.

## 2011-08-20 ENCOUNTER — Telehealth: Payer: Self-pay | Admitting: Cardiology

## 2011-08-20 NOTE — Telephone Encounter (Signed)
Spoke with patient and he is to have fillings  Needs to have 1 tooth built up and crown and 1 lower tooth that will have a filling   Dr Coralee North  [phone] Has not scheduled yet  I called the office and spoke with  Eunice Blase  She states that they usually don't have people hold Coumadin unless it's an extraction  She went and discussed with Dr Coralee North  No need to hold Pt aware and will call the office to schedule  QWill need pre-med secondary to hip per Eunice Blase

## 2011-08-20 NOTE — Telephone Encounter (Signed)
Pt is having dental work done and he may need to stop his coumadin and he wants to know what Dr. Shirlee Latch wants him to do and how long he needs to be off if at all

## 2011-08-21 ENCOUNTER — Ambulatory Visit (INDEPENDENT_AMBULATORY_CARE_PROVIDER_SITE_OTHER): Payer: Medicare Other | Admitting: *Deleted

## 2011-08-21 DIAGNOSIS — I4892 Unspecified atrial flutter: Secondary | ICD-10-CM

## 2011-08-21 DIAGNOSIS — Z7901 Long term (current) use of anticoagulants: Secondary | ICD-10-CM

## 2011-08-21 DIAGNOSIS — Z8679 Personal history of other diseases of the circulatory system: Secondary | ICD-10-CM

## 2011-09-04 ENCOUNTER — Ambulatory Visit (INDEPENDENT_AMBULATORY_CARE_PROVIDER_SITE_OTHER): Payer: Medicare Other | Admitting: *Deleted

## 2011-09-04 DIAGNOSIS — I4892 Unspecified atrial flutter: Secondary | ICD-10-CM

## 2011-09-04 DIAGNOSIS — Z7901 Long term (current) use of anticoagulants: Secondary | ICD-10-CM

## 2011-09-04 DIAGNOSIS — Z8679 Personal history of other diseases of the circulatory system: Secondary | ICD-10-CM

## 2011-09-11 ENCOUNTER — Ambulatory Visit (INDEPENDENT_AMBULATORY_CARE_PROVIDER_SITE_OTHER): Payer: Medicare Other | Admitting: *Deleted

## 2011-09-11 ENCOUNTER — Ambulatory Visit: Payer: Medicare Other

## 2011-09-11 ENCOUNTER — Telehealth: Payer: Self-pay | Admitting: *Deleted

## 2011-09-11 DIAGNOSIS — Z23 Encounter for immunization: Secondary | ICD-10-CM

## 2011-09-14 ENCOUNTER — Ambulatory Visit (INDEPENDENT_AMBULATORY_CARE_PROVIDER_SITE_OTHER): Payer: Medicare Other | Admitting: *Deleted

## 2011-09-14 DIAGNOSIS — Z8679 Personal history of other diseases of the circulatory system: Secondary | ICD-10-CM

## 2011-09-14 DIAGNOSIS — Z7901 Long term (current) use of anticoagulants: Secondary | ICD-10-CM

## 2011-09-14 DIAGNOSIS — I4892 Unspecified atrial flutter: Secondary | ICD-10-CM

## 2011-09-14 LAB — POCT INR: INR: 1.5

## 2011-09-16 NOTE — Progress Notes (Signed)
Agree with above.   [name]  

## 2011-09-28 ENCOUNTER — Ambulatory Visit (INDEPENDENT_AMBULATORY_CARE_PROVIDER_SITE_OTHER): Payer: Medicare Other | Admitting: *Deleted

## 2011-09-28 DIAGNOSIS — I4892 Unspecified atrial flutter: Secondary | ICD-10-CM

## 2011-09-28 DIAGNOSIS — Z8679 Personal history of other diseases of the circulatory system: Secondary | ICD-10-CM

## 2011-09-28 DIAGNOSIS — Z7901 Long term (current) use of anticoagulants: Secondary | ICD-10-CM

## 2011-09-28 LAB — POCT INR: INR: 1.5

## 2011-10-06 ENCOUNTER — Ambulatory Visit (INDEPENDENT_AMBULATORY_CARE_PROVIDER_SITE_OTHER): Payer: Medicare Other | Admitting: Cardiology

## 2011-10-06 ENCOUNTER — Encounter: Payer: Medicare Other | Admitting: *Deleted

## 2011-10-06 ENCOUNTER — Ambulatory Visit (INDEPENDENT_AMBULATORY_CARE_PROVIDER_SITE_OTHER): Payer: Medicare Other | Admitting: *Deleted

## 2011-10-06 ENCOUNTER — Encounter: Payer: Self-pay | Admitting: Cardiology

## 2011-10-06 DIAGNOSIS — I4891 Unspecified atrial fibrillation: Secondary | ICD-10-CM

## 2011-10-06 DIAGNOSIS — I5032 Chronic diastolic (congestive) heart failure: Secondary | ICD-10-CM

## 2011-10-06 DIAGNOSIS — I509 Heart failure, unspecified: Secondary | ICD-10-CM

## 2011-10-06 DIAGNOSIS — Z8679 Personal history of other diseases of the circulatory system: Secondary | ICD-10-CM

## 2011-10-06 DIAGNOSIS — I4892 Unspecified atrial flutter: Secondary | ICD-10-CM

## 2011-10-06 DIAGNOSIS — Z7901 Long term (current) use of anticoagulants: Secondary | ICD-10-CM

## 2011-10-06 DIAGNOSIS — C801 Malignant (primary) neoplasm, unspecified: Secondary | ICD-10-CM

## 2011-10-06 LAB — POCT INR: INR: 2.7

## 2011-10-06 MED ORDER — WARFARIN SODIUM 2.5 MG PO TABS: ORAL_TABLET | ORAL | Status: DC

## 2011-10-06 NOTE — Patient Instructions (Signed)
Your physician recommends that you return for a FASTING lipid profile/liver profile/BMP/TSH 428.31  428.32-- Tuesday February 19,2013.The lab opens at 8:30am. DO NOT EAT OR DRINK AFTER MIDNIGHT THE NIGHT BEFORE THE LAB.  Your physician wants you to follow-up in: 4 months with Dr Shirlee Latch. (February 2013) You will receive a reminder letter in the mail two months in advance. If you don't receive a letter, please call our office to schedule the follow-up appointment.
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
## 2011-10-07 NOTE — Assessment & Plan Note (Addendum)
Minimal dyspnea but weight is increased and left leg edema is increased.  He is no longer taking Lasix.  I suggested that he restart Lasix 20 mg every other day along with KCl.

## 2011-10-07 NOTE — Assessment & Plan Note (Signed)
Patient seems to be remaining in NSR on amiodarone.   - Continue warfarin and amiodarone 200 mg daily.  - Baseline PFTs on amiodarone were normal.  LFTs/TSH were ok recently.  Needs to see eye doctor yearly while on amiodarone.  He has a mild tremor that may be related to amiodarone.  Will need to follow.  - Repeat TSH, LFTs at followup in 4 months.

## 2011-10-07 NOTE — Assessment & Plan Note (Signed)
Abdominal/pelvic CT recently was suggestive of metastatic prostate cancer versus lymphoma.  He is asymptomatic at this time.  Mr. [name] makes it clear that he wants no treatment.  I think this is reasonable as disease appears to be fairly advanced.  I told him that I would refer him to an oncologist if he wanted to pursue evaluation.

## 2011-10-16 NOTE — Telephone Encounter (Signed)
Refill encounter ?

## 2011-10-19 ENCOUNTER — Encounter: Payer: Self-pay | Admitting: Internal Medicine

## 2011-10-19 ENCOUNTER — Ambulatory Visit (INDEPENDENT_AMBULATORY_CARE_PROVIDER_SITE_OTHER): Payer: Medicare Other | Admitting: Internal Medicine

## 2011-10-19 VITALS — BP 158/80 | HR 69 | Temp 97.0°F | Wt 201.0 lb

## 2011-10-19 DIAGNOSIS — R972 Elevated prostate specific antigen [PSA]: Secondary | ICD-10-CM

## 2011-10-19 NOTE — Assessment & Plan Note (Addendum)
Patient with abnormal CT , PSA 470 - very suggestive of metastatic prostate cancer. He is asymptomatic at this time. He reemphasizes that he doesn't want to fill his life with treatment and is willing to accept a potential diagnosis without benefit of definitive work-up. He understands that there may be reasonable treatments to slow the progression of a malignant disease. He wishes to not elect treatment unless it is needed for comfort care.   Plan - watchful waiting and intervene for symptoms.
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
## 2011-10-20 ENCOUNTER — Ambulatory Visit (INDEPENDENT_AMBULATORY_CARE_PROVIDER_SITE_OTHER): Payer: Medicare Other | Admitting: *Deleted

## 2011-10-20 DIAGNOSIS — I4892 Unspecified atrial flutter: Secondary | ICD-10-CM

## 2011-10-20 DIAGNOSIS — Z7901 Long term (current) use of anticoagulants: Secondary | ICD-10-CM

## 2011-10-20 DIAGNOSIS — Z8679 Personal history of other diseases of the circulatory system: Secondary | ICD-10-CM

## 2011-10-20 LAB — POCT INR: INR: 3.7

## 2011-11-03 ENCOUNTER — Ambulatory Visit (INDEPENDENT_AMBULATORY_CARE_PROVIDER_SITE_OTHER): Payer: Medicare Other | Admitting: *Deleted

## 2011-11-03 DIAGNOSIS — Z8679 Personal history of other diseases of the circulatory system: Secondary | ICD-10-CM

## 2011-11-03 DIAGNOSIS — I4892 Unspecified atrial flutter: Secondary | ICD-10-CM

## 2011-11-03 DIAGNOSIS — Z7901 Long term (current) use of anticoagulants: Secondary | ICD-10-CM

## 2011-11-03 LAB — POCT INR: INR: 3.6

## 2011-11-17 ENCOUNTER — Encounter: Payer: Medicare Other | Admitting: *Deleted

## 2011-11-23 ENCOUNTER — Ambulatory Visit (INDEPENDENT_AMBULATORY_CARE_PROVIDER_SITE_OTHER): Payer: Medicare Other | Admitting: *Deleted

## 2011-11-23 DIAGNOSIS — Z8679 Personal history of other diseases of the circulatory system: Secondary | ICD-10-CM

## 2011-11-23 DIAGNOSIS — Z7901 Long term (current) use of anticoagulants: Secondary | ICD-10-CM

## 2011-11-23 DIAGNOSIS — I4892 Unspecified atrial flutter: Secondary | ICD-10-CM

## 2011-12-04 ENCOUNTER — Ambulatory Visit (INDEPENDENT_AMBULATORY_CARE_PROVIDER_SITE_OTHER): Payer: Medicare Other | Admitting: *Deleted

## 2011-12-04 DIAGNOSIS — Z7901 Long term (current) use of anticoagulants: Secondary | ICD-10-CM

## 2011-12-04 DIAGNOSIS — Z8679 Personal history of other diseases of the circulatory system: Secondary | ICD-10-CM

## 2011-12-04 DIAGNOSIS — I4892 Unspecified atrial flutter: Secondary | ICD-10-CM

## 2011-12-25 ENCOUNTER — Ambulatory Visit (INDEPENDENT_AMBULATORY_CARE_PROVIDER_SITE_OTHER): Payer: Medicare Other | Admitting: *Deleted

## 2011-12-25 DIAGNOSIS — Z8679 Personal history of other diseases of the circulatory system: Secondary | ICD-10-CM

## 2011-12-25 DIAGNOSIS — Z7901 Long term (current) use of anticoagulants: Secondary | ICD-10-CM

## 2011-12-25 DIAGNOSIS — I4892 Unspecified atrial flutter: Secondary | ICD-10-CM

## 2012-01-04 ENCOUNTER — Ambulatory Visit: Payer: Medicare Other | Admitting: Internal Medicine

## 2012-01-04 ENCOUNTER — Ambulatory Visit (INDEPENDENT_AMBULATORY_CARE_PROVIDER_SITE_OTHER): Payer: Medicare Other | Admitting: Internal Medicine

## 2012-01-04 ENCOUNTER — Telehealth: Payer: Self-pay | Admitting: Cardiology

## 2012-01-04 DIAGNOSIS — H8309 Labyrinthitis, unspecified ear: Secondary | ICD-10-CM

## 2012-01-04 DIAGNOSIS — G252 Other specified forms of tremor: Secondary | ICD-10-CM

## 2012-01-04 DIAGNOSIS — R972 Elevated prostate specific antigen [PSA]: Secondary | ICD-10-CM

## 2012-01-04 DIAGNOSIS — G250 Essential tremor: Secondary | ICD-10-CM

## 2012-01-04 NOTE — Telephone Encounter (Signed)
New Msg: Pt calling wanting to know if he can set up appt to see Dr. Shirlee Latch. Pt c/o falling a lot and very shaky/dizzy, and weak.  Please return pt call to discuss further.

## 2012-01-04 NOTE — Telephone Encounter (Signed)
I talked with pt. Pt states over the weekend when he raised his left leg to step onto a curb he lost his balance. Pt denies any dizziness or lightheadedness, SOB, chest pain, tachycardia or palpitations. Pt does not know his BP. Pt advised to follow-up with Dr [name] his PCP for further recommendations.

## 2012-01-04 NOTE — Assessment & Plan Note (Signed)
Patient with probable metastatic prostate cancer but he has been asymptomatic. 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  Plan- for persistent balance problems, tremor will use MRI to image the spine and spinal canal/chord.
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
## 2012-01-18 ENCOUNTER — Ambulatory Visit (INDEPENDENT_AMBULATORY_CARE_PROVIDER_SITE_OTHER): Payer: Medicare Other | Admitting: Pharmacist

## 2012-01-18 DIAGNOSIS — Z8679 Personal history of other diseases of the circulatory system: Secondary | ICD-10-CM

## 2012-01-18 DIAGNOSIS — I4892 Unspecified atrial flutter: Secondary | ICD-10-CM

## 2012-01-18 DIAGNOSIS — Z7901 Long term (current) use of anticoagulants: Secondary | ICD-10-CM

## 2012-01-18 LAB — POCT INR: INR: 5.2

## 2012-01-28 ENCOUNTER — Ambulatory Visit (INDEPENDENT_AMBULATORY_CARE_PROVIDER_SITE_OTHER): Payer: Medicare Other | Admitting: Internal Medicine

## 2012-01-28 ENCOUNTER — Encounter: Payer: Self-pay | Admitting: Internal Medicine

## 2012-01-28 VITALS — BP 130/58 | HR 60 | Temp 98.5°F | Resp 14 | Wt 202.0 lb

## 2012-01-28 DIAGNOSIS — R42 Dizziness and giddiness: Secondary | ICD-10-CM

## 2012-01-30 NOTE — Progress Notes (Signed)
  Subjective:    Patient ID: [name], male    DOB: 1922/10/15, 78 y.o.   MRN: [id number]  HPI [name], a very interesting 78 y/o, presents due to continued dizziness and a recent fall. He had been prescribed meclizine previously for dysequilibrium which has been of no avail. He does report that since December he had increased his amiodarone from 200 mg daily to 400 mg tid, as listed on the label of his med bottle! Any balance problem is exacerbated by having a short-leg on the left.  PMH, FamHx and SocHx reviewed for any changes and relevance.    Review of Systems System review is negative for any constitutional, cardiac, pulmonary, GI or neuro symptoms or complaints other than as described in the HPI.     Objective:   Physical Exam Filed Vitals:   01/28/12 1147  BP: 130/58  Pulse: 60  Temp: 98.5 F (36.9 C)  Resp: 14   Gen'l- older man looking younger than his 78 years in no distress Pulm - normal Cor - RRR Neuro - A&O x 3, CN II-XII normal, gait - hindered chronically with no increase in ataxia.       Assessment & Plan:  Dizziness - THINK drugs - he has been taking an excessive dose of amiodarone for two months due to labeling issue. Plan - he will resume amiodarone at 200 mg daily           If symptoms of dizziness do not improve will need to consider neuro-imaging for cerebellar injury ot V-B insufficiency.

## 2012-02-01 ENCOUNTER — Ambulatory Visit (INDEPENDENT_AMBULATORY_CARE_PROVIDER_SITE_OTHER): Payer: Medicare Other

## 2012-02-01 DIAGNOSIS — I4892 Unspecified atrial flutter: Secondary | ICD-10-CM

## 2012-02-01 DIAGNOSIS — Z7901 Long term (current) use of anticoagulants: Secondary | ICD-10-CM

## 2012-02-01 DIAGNOSIS — Z8679 Personal history of other diseases of the circulatory system: Secondary | ICD-10-CM

## 2012-02-09 ENCOUNTER — Encounter: Payer: Self-pay | Admitting: Cardiology

## 2012-02-09 ENCOUNTER — Other Ambulatory Visit (INDEPENDENT_AMBULATORY_CARE_PROVIDER_SITE_OTHER): Payer: Medicare Other

## 2012-02-09 ENCOUNTER — Ambulatory Visit (INDEPENDENT_AMBULATORY_CARE_PROVIDER_SITE_OTHER): Payer: Medicare Other | Admitting: Cardiology

## 2012-02-09 VITALS — BP 120/68 | HR 62 | Ht 72.0 in | Wt 198.0 lb

## 2012-02-09 DIAGNOSIS — I4891 Unspecified atrial fibrillation: Secondary | ICD-10-CM

## 2012-02-09 DIAGNOSIS — I509 Heart failure, unspecified: Secondary | ICD-10-CM

## 2012-02-09 DIAGNOSIS — I2510 Atherosclerotic heart disease of native coronary artery without angina pectoris: Secondary | ICD-10-CM

## 2012-02-09 DIAGNOSIS — I5032 Chronic diastolic (congestive) heart failure: Secondary | ICD-10-CM

## 2012-02-09 DIAGNOSIS — R0602 Shortness of breath: Secondary | ICD-10-CM

## 2012-02-09 LAB — BASIC METABOLIC PANEL
BUN: 15 mg/dL (ref 6–23)
Chloride: 102 mEq/L (ref 96–112)
Glucose, Bld: 94 mg/dL (ref 70–99)
Potassium: 4.4 mEq/L (ref 3.5–5.1)

## 2012-02-09 LAB — TSH: TSH: 1.3 u[IU]/mL (ref 0.35–5.50)

## 2012-02-09 LAB — HEPATIC FUNCTION PANEL
AST: 25 U/L (ref 0–37)
Albumin: 3.5 g/dL (ref 3.5–5.2)
Alkaline Phosphatase: 196 U/L — ABNORMAL HIGH (ref 39–117)

## 2012-02-09 LAB — LIPID PANEL
LDL Cholesterol: 99 mg/dL (ref 0–99)
VLDL: 10.8 mg/dL (ref 0.0–40.0)

## 2012-02-09 MED ORDER — AMIODARONE HCL 100 MG PO TABS: 100.0000 mg | ORAL_TABLET | Freq: Every day | ORAL | Status: DC

## 2012-02-09 NOTE — Assessment & Plan Note (Signed)
Known distal LAD disease.  No chest pain, no exertional dyspnea.

## 2012-02-09 NOTE — Assessment & Plan Note (Signed)
Remains in NSR on amiodarone.  He went for 2 months accidentally taking 400 mg tid of amiodarone.  He is back to 200 mg daily.  His dizziness and tremor could certainly have been neurological side effects of a high dose of amiodarone.   - Decrease amiodarone to 100 mg daily.  - Check LFTs and TSH - Yearly eye exam - Continue coumadin

## 2012-02-09 NOTE — Patient Instructions (Signed)
Decrease amiodarone to 100mg  daily. You can take one-half 200mg  daily.  Your physician recommends that you have lab work today--Lipid profile/liver profile/BMET/BNP   Your physician recommends that you schedule a follow-up appointment in: 3 months with Dr Shirlee Latch

## 2012-02-09 NOTE — Assessment & Plan Note (Signed)
Stable with minimal dyspnea on exertion.  He does not look significantly volume overloaded on exam .
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
## 2012-02-10 ENCOUNTER — Ambulatory Visit (INDEPENDENT_AMBULATORY_CARE_PROVIDER_SITE_OTHER): Payer: Medicare Other | Admitting: Internal Medicine

## 2012-02-10 ENCOUNTER — Encounter: Payer: Self-pay | Admitting: Internal Medicine

## 2012-02-10 VITALS — BP 140/72 | HR 64 | Temp 98.6°F

## 2012-02-10 DIAGNOSIS — R972 Elevated prostate specific antigen [PSA]: Secondary | ICD-10-CM

## 2012-02-10 DIAGNOSIS — M533 Sacrococcygeal disorders, not elsewhere classified: Secondary | ICD-10-CM

## 2012-02-10 MED ORDER — HYDROCODONE-ACETAMINOPHEN 10-325 MG PO TABS: 0.5000 | ORAL_TABLET | Freq: Three times a day (TID) | ORAL | Status: AC | PRN

## 2012-02-10 NOTE — Assessment & Plan Note (Signed)
Pt has declined bx - but presumed met prostate ca given PSA>400 and RP LAD/vertebral lesions on CT a/p 07/2011 Does not desire tx - declines chemo or XRT Now increasing posterior pelvic pain, especially with WB activity Will tx with prn Norco - offered Xrays to look for path fx (esp with recent falls 12/2011) and pt declines same follow up with PCP in 2 weeks, sooner if problems or worse

## 2012-02-10 NOTE — Patient Instructions (Signed)
It was good to see you today. If you decide to have an x-ray to look for fractures as we discussed, please call us and we will order the same In the meanwhile, try Norco half or whole tablet every 8 hours as needed for pain symptoms - Your prescription(s) have been submitted to your pharmacy. Please take as directed and contact our office if you believe you are having problem(s) with the medication(s). Please followup with Dr. [name] in 2 weeks, call sooner if symptoms worse or problems with medications

## 2012-02-10 NOTE — Progress Notes (Signed)
Subjective:    Patient ID: [name], male    DOB: [birth year], 78 y.o.   MRN: [id number]  HPI  Complains of increasing low back and pelvic pain Chronic symptoms over past 6 months but increase in last 2 weeks Worse pain symptoms with cold weather and wet rainy weather Pain worse with weightbearing and activity, improved with rest and lying down Improved with over-the-counter acetaminophen for past several months but for relief of symptoms in past 2 weeks Denies new falls in past 2 weeks No bruising, radiation of pain or swelling of joints  Past Medical History  Diagnosis Date  . Atrial fibrillation 2002     Paroxysmal. Episode in 2002 associated with syncope.  No documented recurrence until 2/12. Event monitor 2/12-3/12 with occasional runs of atrial fibrillation with rapid response.  He also had pauses up to 3.5 seconds while in atrial fibrillation, suggesting tachy-brady syndrome.  . Elevated prostate specific antigen (PSA)     presumed met prostate cancer (PSA >400, RP LAD on CT 8/12 and verteb mets, declined bx)  . Allergic rhinitis, cause unspecified   . Osteoarthrosis, unspecified whether generalized or localized, lower leg   . Coronary atherosclerosis of unspecified type of vessel, native or graft     75-80% distal LAD stenosis, medically managed.    . Lower extremity edema     chronic  . Gastric ulcer, unspecified as acute or chronic, without mention of hemorrhage, perforation, or obstruction   . Calculus of gallbladder without mention of cholecystitis or obstruction 2005  . Carbuncle and furuncle of unspecified site   . GERD (gastroesophageal reflux disease)    Review of Systems  Constitutional: Positive for fatigue. Negative for fever and unexpected weight change.  Respiratory: Negative for cough and shortness of breath.   Cardiovascular: Negative for chest pain and leg swelling.  Neurological: Negative for syncope and headaches.       Objective:   Physical  Exam BP 140/72  Pulse 64  Temp(Src) 98.6 F (37 C) (Oral)  SpO2 97% Wt Readings from Last 3 Encounters:  02/09/12 198 lb (89.812 kg)  01/28/12 202 lb (91.627 kg)  01/04/12 203 lb (92.08 kg)   General: No acute distress, spry and interesting elderly man, 3 days away from 89th birthday Lungs: Clear to auscultation bilaterally, no increased work of breathing Cardio vascular: Regular rate and rhythm, no edema Musculoskeletal: Tenderness to palpation over the sacrum, L5 and bilateral SI region right greater than left side - pain with weightbearing -but gait unaided - chronically antalgic due to left lower extremity shortening and outward turned foot at ankle  Lab Results  Component Value Date   WBC 5.7 06/08/2011   HGB 12.0* 06/08/2011   HCT 35.1* 06/08/2011   PLT 188.0 06/08/2011   GLUCOSE 94 02/09/2012   CHOL 164 02/09/2012   TRIG 54.0 02/09/2012   HDL 53.80 02/09/2012   LDLCALC 99 02/09/2012   ALT 24 02/09/2012   AST 25 02/09/2012   NA 138 02/09/2012   K 4.4 02/09/2012   CL 102 02/09/2012   CREATININE 1.1 02/09/2012   BUN 15 02/09/2012   CO2 30 02/09/2012   TSH 1.30 02/09/2012   INR 3.0 02/01/2012        Assessment & Plan:  Low back pain, sacral pain and bilateral SI pain - risk of pathologic fracture due to known recent falls and presumed metastatic prostate cancer - see details below - on narcotic pain relief and symptomatic care, followup with primary care  physician in next 2 weeks, sooner if worse

## 2012-02-11 ENCOUNTER — Telehealth: Payer: Self-pay | Admitting: Cardiology

## 2012-02-11 NOTE — Telephone Encounter (Signed)
Follow up from previous call:  Patient calling back regarding test results

## 2012-02-11 NOTE — Telephone Encounter (Signed)
Discussed recent lab results with pt.  

## 2012-02-15 ENCOUNTER — Ambulatory Visit (INDEPENDENT_AMBULATORY_CARE_PROVIDER_SITE_OTHER): Payer: Medicare Other | Admitting: Pharmacist

## 2012-02-15 DIAGNOSIS — Z8679 Personal history of other diseases of the circulatory system: Secondary | ICD-10-CM

## 2012-02-15 DIAGNOSIS — I4892 Unspecified atrial flutter: Secondary | ICD-10-CM

## 2012-02-15 DIAGNOSIS — Z7901 Long term (current) use of anticoagulants: Secondary | ICD-10-CM

## 2012-02-15 LAB — POCT INR: INR: 2.6

## 2012-02-25 ENCOUNTER — Encounter: Payer: Self-pay | Admitting: Internal Medicine

## 2012-02-25 ENCOUNTER — Ambulatory Visit (INDEPENDENT_AMBULATORY_CARE_PROVIDER_SITE_OTHER): Payer: Medicare Other | Admitting: Internal Medicine

## 2012-02-25 DIAGNOSIS — R972 Elevated prostate specific antigen [PSA]: Secondary | ICD-10-CM

## 2012-02-25 MED ORDER — OXYCODONE-ACETAMINOPHEN 10-325 MG PO TABS: 1.0000 | ORAL_TABLET | Freq: Three times a day (TID) | ORAL | Status: DC | PRN

## 2012-02-25 MED ORDER — FENTANYL 25 MCG/HR TD PT72: 1.0000 | MEDICATED_PATCH | TRANSDERMAL | Status: DC

## 2012-02-25 NOTE — Progress Notes (Signed)
  Subjective:    Patient ID: [name], male    DOB: [birth year], 78 y.o.   MRN: [id number]  HPI [name] presents for pain management. He has metastatic prostate cancer for which he has declined treatment. He most likely has bone mets to spine and pelvis as well as soft tissue extension with lymphadenopathy on CT. He was recently seen by Dr. Felicity Coyer and started on hydrocodone which has not provided much if any relief.   PMH, FamHx and SocHx reviewed for any changes and relevance.    Review of Systems System review is negative for any constitutional, cardiac, pulmonary, GI or neuro symptoms or complaints other than as described in the HPI.     Objective:   Physical Exam Filed Vitals:   02/25/12 1038  BP: 102/62  Pulse: 68  Temp: 98.1 F (36.7 C)  Resp: 16  gen'l- elderly white man in no acute distress. He is alert and attentive with humor intact. HEENT C&S clear Pulm - normal respirations Cor - RRR MSK - short left leg and limp. Neuro - A&O x 3        Assessment & Plan:

## 2012-02-25 NOTE — Patient Instructions (Signed)
Pain management - a bone scan is recommended in order to know with greater accuracy if the predominant cause of your pain is from bone mets or if it is soft tissue related disease. If it is predominantly bone related strontium 89 or zoledronic acid may be good treatments.  For pain management now- will start fentanyl patch 25 mcg - apply the patch every 72 hours. We can make step-wise increases in the dosage based on your response. You can call in a report as to how you are doing. For break-through pain will change to oxycodone/APAP (percocet) 10/325 every 6 hours as needed.

## 2012-02-25 NOTE — Assessment & Plan Note (Addendum)
Patient with probable metastatic prostate disease with subsequent pain due to bone and soft tissue mets. Discussed options with him: suggested Bone Scan to aide in more specific diagnosis and possible use of bisphosphonate therapy or Strontium for bone mets. He declines at this time.  For pain management will start fentanyl patch 25 mcg with intent to titrate up the dose every 3-5 days to a level of comfort. He will report back Monday, March 11th. Will use percocet 10/325 for breakthrough pain.

## 2012-02-28 ENCOUNTER — Other Ambulatory Visit: Payer: Self-pay

## 2012-02-28 ENCOUNTER — Encounter (HOSPITAL_COMMUNITY): Payer: Self-pay | Admitting: Emergency Medicine

## 2012-02-28 ENCOUNTER — Inpatient Hospital Stay (HOSPITAL_COMMUNITY)
Admission: EM | Admit: 2012-02-28 | Discharge: 2012-03-01 | DRG: 544 | Disposition: A | Discharge status: Home / Self Care | Payer: Medicare Other | Source: Ambulatory Visit | Attending: Internal Medicine | Admitting: Internal Medicine

## 2012-02-28 DIAGNOSIS — R55 Syncope and collapse: Secondary | ICD-10-CM | POA: Diagnosis present

## 2012-02-28 DIAGNOSIS — C7952 Secondary malignant neoplasm of bone marrow: Principal | ICD-10-CM | POA: Diagnosis present

## 2012-02-28 DIAGNOSIS — W19XXXA Unspecified fall, initial encounter: Secondary | ICD-10-CM | POA: Diagnosis present

## 2012-02-28 DIAGNOSIS — C61 Malignant neoplasm of prostate: Secondary | ICD-10-CM | POA: Diagnosis present

## 2012-02-28 DIAGNOSIS — M1990 Unspecified osteoarthritis, unspecified site: Secondary | ICD-10-CM | POA: Diagnosis present

## 2012-02-28 DIAGNOSIS — I2510 Atherosclerotic heart disease of native coronary artery without angina pectoris: Secondary | ICD-10-CM | POA: Diagnosis present

## 2012-02-28 DIAGNOSIS — I4891 Unspecified atrial fibrillation: Secondary | ICD-10-CM | POA: Diagnosis present

## 2012-02-28 DIAGNOSIS — C7951 Secondary malignant neoplasm of bone: Principal | ICD-10-CM | POA: Diagnosis present

## 2012-02-28 DIAGNOSIS — R5381 Other malaise: Secondary | ICD-10-CM | POA: Diagnosis present

## 2012-02-28 DIAGNOSIS — R531 Weakness: Secondary | ICD-10-CM | POA: Diagnosis present

## 2012-02-28 DIAGNOSIS — K219 Gastro-esophageal reflux disease without esophagitis: Secondary | ICD-10-CM | POA: Diagnosis present

## 2012-02-28 DIAGNOSIS — R269 Unspecified abnormalities of gait and mobility: Secondary | ICD-10-CM | POA: Diagnosis present

## 2012-02-28 DIAGNOSIS — Z8711 Personal history of peptic ulcer disease: Secondary | ICD-10-CM

## 2012-02-28 DIAGNOSIS — R5383 Other fatigue: Secondary | ICD-10-CM | POA: Diagnosis present

## 2012-02-28 DIAGNOSIS — Z7901 Long term (current) use of anticoagulants: Secondary | ICD-10-CM

## 2012-02-28 LAB — URINALYSIS, ROUTINE W REFLEX MICROSCOPIC
Ketones, ur: 15 mg/dL — AB
Leukocytes, UA: NEGATIVE
Nitrite: NEGATIVE
Protein, ur: NEGATIVE mg/dL
Urobilinogen, UA: 0.2 mg/dL (ref 0.0–1.0)

## 2012-02-28 MED ORDER — SODIUM CHLORIDE 0.9 % IV BOLUS (SEPSIS)
500.0000 mL | Freq: Once | INTRAVENOUS | Status: AC
  Administered 2012-02-28: 500 mL via INTRAVENOUS
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
## 2012-02-28 NOTE — ED Notes (Signed)
EDP at bedside  

## 2012-02-28 NOTE — ED Notes (Signed)
Patient given water; OKed by EDP.  Lab at bedside.

## 2012-02-28 NOTE — ED Notes (Signed)
Pharmacy at bedside

## 2012-02-28 NOTE — ED Notes (Signed)
Per EMS, patient complaining of a near syncopal episode.  Patient denies passing out, but patient did fall to floor.  Denies pain or injury.  Patient unsure of what he was doing at the time of the near syncopal episode.  Patient states that he has been feeling weak for the past two to three hours.  Patient normally able to ambulate with assistance; not able to ambulate at this time.  Patient also having mild tremors; both left side and right side of body; no history of seizures.  Patient has first degree heart block, otherwise NSR.  IV started in left AC; 18 GA.

## 2012-02-28 NOTE — ED Notes (Signed)
Patient lives at home alone.  Patient complaining of feeling faint tonight; patient states that he had a near syncopal episode tonight around 2130  and fell down.  Patient complaining of weakness; states that the weakness started around tonight at 1900.  Denies hitting head/any injury from fall.  Patient denies pain.  Patient has bloody tissue on lap; states that he sometimes gets a bloody nose from the Coumadin that he is on, but that it stops.  No active bleeding noted at this time.  Patient having tremors in both arms; patient states that they started around with the faintness/weakness tonight.  Both right arm and left arm have noted tremors.  Patient alert and oriented x4; PERRL present. Will continue to monitor.

## 2012-02-29 DIAGNOSIS — R531 Weakness: Secondary | ICD-10-CM | POA: Diagnosis present

## 2012-02-29 DIAGNOSIS — R5383 Other fatigue: Secondary | ICD-10-CM

## 2012-02-29 DIAGNOSIS — W19XXXA Unspecified fall, initial encounter: Secondary | ICD-10-CM | POA: Diagnosis present

## 2012-02-29 DIAGNOSIS — R269 Unspecified abnormalities of gait and mobility: Secondary | ICD-10-CM | POA: Diagnosis present

## 2012-02-29 DIAGNOSIS — R5381 Other malaise: Secondary | ICD-10-CM

## 2012-02-29 LAB — POCT I-STAT, CHEM 8
BUN: 12 mg/dL (ref 6–23)
Calcium, Ion: 1.13 mmol/L (ref 1.12–1.32)
Chloride: 100 mEq/L (ref 96–112)
Glucose, Bld: 139 mg/dL — ABNORMAL HIGH (ref 70–99)
HCT: 36 % — ABNORMAL LOW (ref 39.0–52.0)
TCO2: 28 mmol/L (ref 0–100)

## 2012-02-29 LAB — CBC
HCT: 35.9 % — ABNORMAL LOW (ref 39.0–52.0)
MCHC: 34.5 g/dL (ref 30.0–36.0)
MCV: 88.2 fL (ref 78.0–100.0)
Platelets: 192 10*3/uL (ref 150–400)
RDW: 14.4 % (ref 11.5–15.5)
WBC: 6.4 10*3/uL (ref 4.0–10.5)

## 2012-02-29 LAB — BASIC METABOLIC PANEL
Calcium: 8.6 mg/dL (ref 8.4–10.5)
Creatinine, Ser: 0.94 mg/dL (ref 0.50–1.35)
GFR calc Af Amer: 84 mL/min — ABNORMAL LOW (ref >90)
Sodium: 134 mEq/L — ABNORMAL LOW (ref 135–145)

## 2012-02-29 LAB — DIFFERENTIAL
Basophils Absolute: 0 10*3/uL (ref 0.0–0.1)
Basophils Relative: 0 % (ref 0–1)
Eosinophils Relative: 1 % (ref 0–5)
Monocytes Absolute: 1.1 10*3/uL — ABNORMAL HIGH (ref 0.1–1.0)
Neutro Abs: 4.5 10*3/uL (ref 1.7–7.7)

## 2012-02-29 LAB — PROTIME-INR: Prothrombin Time: 37.4 seconds — ABNORMAL HIGH (ref 11.6–15.2)

## 2012-02-29 MED ORDER — WHITE PETROLATUM GEL
Status: AC
  Administered 2012-02-29: 10:00:00
  Filled 2012-02-29: qty 5

## 2012-02-29 MED ORDER — DOCUSATE SODIUM 100 MG PO CAPS
100.0000 mg | ORAL_CAPSULE | Freq: Two times a day (BID) | ORAL | Status: DC
  Administered 2012-02-29 – 2012-03-01 (×3): 100 mg via ORAL
  Filled 2012-02-29 (×3): qty 1

## 2012-02-29 MED ORDER — DOXAZOSIN MESYLATE 2 MG PO TABS
2.0000 mg | ORAL_TABLET | Freq: Every day | ORAL | Status: DC
  Administered 2012-02-29: 2 mg via ORAL
  Filled 2012-02-29 (×2): qty 1

## 2012-02-29 MED ORDER — SODIUM CHLORIDE 0.9 % IV SOLN: Freq: Once | INTRAVENOUS | Status: DC

## 2012-02-29 MED ORDER — AMIODARONE HCL 100 MG PO TABS
50.0000 mg | ORAL_TABLET | Freq: Every day | ORAL | Status: DC
  Administered 2012-02-29 – 2012-03-01 (×2): 50 mg via ORAL
  Filled 2012-02-29 (×2): qty 1

## 2012-02-29 MED ORDER — FENTANYL 25 MCG/HR TD PT72: 25.0000 ug | MEDICATED_PATCH | TRANSDERMAL | Status: DC

## 2012-02-29 MED ORDER — FINASTERIDE 5 MG PO TABS
5.0000 mg | ORAL_TABLET | Freq: Every day | ORAL | Status: DC
  Administered 2012-02-29 – 2012-03-01 (×2): 5 mg via ORAL
  Filled 2012-02-29 (×2): qty 1

## 2012-02-29 MED ORDER — ACETAMINOPHEN 325 MG PO TABS: 650.0000 mg | ORAL_TABLET | Freq: Four times a day (QID) | ORAL | Status: DC | PRN

## 2012-02-29 MED ORDER — WARFARIN - PHARMACIST DOSING INPATIENT: Freq: Every day | Status: DC

## 2012-02-29 MED ORDER — POLYETHYLENE GLYCOL 3350 17 G PO PACK
17.0000 g | PACK | Freq: Every day | ORAL | Status: DC | PRN
  Filled 2012-02-29: qty 1

## 2012-02-29 MED ORDER — ONDANSETRON HCL 4 MG PO TABS: 4.0000 mg | ORAL_TABLET | Freq: Four times a day (QID) | ORAL | Status: DC | PRN

## 2012-02-29 MED ORDER — SENNA 8.6 MG PO TABS
1.0000 | ORAL_TABLET | Freq: Two times a day (BID) | ORAL | Status: DC
  Administered 2012-02-29 – 2012-03-01 (×3): 8.6 mg via ORAL
  Filled 2012-02-29 (×4): qty 1

## 2012-02-29 MED ORDER — HYDROMORPHONE HCL PF 1 MG/ML IJ SOLN: 1.0000 mg | Freq: Four times a day (QID) | INTRAMUSCULAR | Status: DC | PRN

## 2012-02-29 MED ORDER — ONDANSETRON HCL 4 MG/2ML IJ SOLN: 4.0000 mg | Freq: Four times a day (QID) | INTRAMUSCULAR | Status: DC | PRN

## 2012-02-29 MED ORDER — ACETAMINOPHEN 650 MG RE SUPP: 650.0000 mg | Freq: Four times a day (QID) | RECTAL | Status: DC | PRN
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
## 2012-02-29 NOTE — Progress Notes (Signed)
ANTICOAGULATION CONSULT NOTE - Initial Consult  Pharmacy Consult for coumadin Indication: atrial fibrillation  Allergies  Allergen Reactions  . Sulfonamide Derivatives Other (See Comments)    "ended up in the hospital"  . Percocet (Oxycodone-Acetaminophen) Nausea And Vomiting   Vital Signs: Temp: 97.9 F (36.6 C) (03/11 0519) Temp src: Rectal (03/10 2322) BP: 168/72 mmHg (03/11 0521) Pulse Rate: 67  (03/11 0521)  Labs:  Basename 02/29/12 0001 02/28/12 2330  HGB 12.2* 12.4*  HCT 36.0* 35.9*  PLT -- 192  APTT -- --  LABPROT -- 37.4*  INR -- 3.72*  HEPARINUNFRC -- --  CREATININE 1.00 0.94  CKTOTAL -- --  CKMB -- --  TROPONINI -- --   Medical History: Past Medical History  Diagnosis Date  . Atrial fibrillation 2002     Paroxysmal. Episode in 2002 associated with syncope.  No documented recurrence until 2/12. Event monitor 2/12-3/12 with occasional runs of atrial fibrillation with rapid response.  He also had pauses up to 3.5 seconds while in atrial fibrillation, suggesting tachy-brady syndrome.  . Elevated prostate specific antigen (PSA)     presumed met prostate cancer (PSA >400, RP LAD on CT 8/12 and verteb mets, declined bx)  . Allergic rhinitis, cause unspecified   . Osteoarthrosis, unspecified whether generalized or localized, lower leg   . Coronary atherosclerosis of unspecified type of vessel, native or graft     75-80% distal LAD stenosis, medically managed.    . Lower extremity edema     chronic  . Gastric ulcer, unspecified as acute or chronic, without mention of hemorrhage, perforation, or obstruction   . Calculus of gallbladder without mention of cholecystitis or obstruction 2005  . Carbuncle and furuncle of unspecified site   . GERD (gastroesophageal reflux disease)     Medications:  Prescriptions prior to admission  Medication Sig Dispense Refill  . acetaminophen (TYLENOL) 500 MG tablet Take 500 mg by mouth every 6 (six) hours as needed. For pain        . amiodarone (PACERONE) 100 MG tablet Take 50 mg by mouth daily.      . Cholecalciferol (VITAMIN D3) 1000 UNITS CAPS Take 1 capsule by mouth daily.        Marland Kitchen doxazosin (CARDURA) 2 MG tablet Take 2 mg by mouth at bedtime.        . fentaNYL (DURAGESIC) 25 MCG/HR Place 1 patch (25 mcg total) onto the skin every 3 (three) days.  5 patch  0  . finasteride (PROSCAR) 5 MG tablet Take 5 mg by mouth daily.        . Multiple Vitamin (MULTIVITAMIN) tablet Take 1 tablet by mouth daily.        . OMEGA 3 1200 MG CAPS Take 2 capsules by mouth daily.        Marland Kitchen warfarin (COUMADIN) 5 MG tablet Take 5-10 mg by mouth See admin instructions. Take 2 tablets on Tuesday, Thursday and Sunday, and 1 tablet on all other days.        Assessment: 78 yo who was admitted for weakness and fall. He has been on chronic coumadin at home for afib. His admission INR was 3.72 on home dose. Rx has been asked to dose coumadin while he is here.    Goal of Therapy:  INR 2-3   Plan:  1. No coumadin today 2. Daily PT/INR   [name] [facility] 02/29/2012,9:37 AM

## 2012-02-29 NOTE — Progress Notes (Addendum)
Chart reviewed, discussed with CSW, noted recommendations by PT for assistance in the home. Please be aware that insurance does not cover care in the home for "Chores".  If this pt qualifies for Medicaid , he could apply for Personal Care Services , process takes 4-6 weeks and begins after d/c.  HHRN could be arranged with a few HH aide visits, this would not be ongoing and the [facility] aide would provide personal care only ( help with bath, linen changes) no household chores.  MD would this pt be appropriate of [facility]? This would provide [facility] and HH aide.  Spoke with CSW who will discuss ALF with this pt, however this appears that it would be private pay for this pt, as there is not documentation of Medicaid.  Johny Shock RN MPH Case Manager [phone]

## 2012-02-29 NOTE — ED Notes (Signed)
Patient resting quietly in bed; no respiratory or acute distress noted.  Will continue to monitor. 

## 2012-02-29 NOTE — ED Notes (Signed)
Admitting MD at bedside.

## 2012-02-29 NOTE — Progress Notes (Signed)
Subjective: Patient admitted after an episode of overwhelming weakness, different from any previous episodes. He does not report any perceptible arrythmia, pain or other focal issues. He made a good recovery by the time of his ED evaluation. Initial labs and studies reviewed and are unremarkable.  This AM he is feeling OK - at his baseline. He has had good pain relief with the fentanyl patch.  Objective: Lab: Lab Results  Component Value Date   WBC 6.4 02/28/2012   HGB 12.4* 02/28/2012   HCT 35.9* 02/28/2012   MCV 88.2 02/28/2012   PLT 192 02/28/2012   BMET    Component Value Date/Time   NA 134* 02/28/2012 2330   K 3.9 02/28/2012 2330   CL 98 02/28/2012 2330   CO2 27 02/28/2012 2330   GLUCOSE 139* 02/28/2012 2330   BUN 12 02/28/2012 2330   CREATININE 0.94 02/28/2012 2330   CALCIUM 8.6 02/28/2012 2330   GFRNONAA 72* 02/28/2012 2330   GFRAA 84* 02/28/2012 2330    Cardiac enzymes negative.  Imaging: no imaging   Physical Exam: Filed Vitals:   02/29/12 0521  BP: 168/72  Pulse: 67  Temp:   Resp:   Elderly white man in no distress - awake and alert HEENT - w/o trauma Pulm - normal respirations. Cor - IRIR rate controlled Abd- soft Neuor A&O x 3     Assessment/Plan: 1. Weakness - etiology not clear. Does not sound cardiac but sudden tachyrhythmia is possible. He has just started on fentanyl and was taking percocet concomitantly for a few doses. Labs are OK. Plan - observation  2. Goals of care - he is steadfast in not wanting diagnostic testing or aggressive treatment. We discussed the need for a safer living arrangement i.e. AL. Plan - he will be talking with his son-in-law and start some planning for a safer living situation.  3.A. Fib - will continue home meds. Pharmacy to assist with coumadin management.  4. Prostate Cancer - metastatic disease. He declines further diagnostics and has declined treatment for the pain of bone mets except for narcotics.   Illene Regulus 02/29/2012, 7:48 AM

## 2012-02-29 NOTE — Progress Notes (Addendum)
Clinical Social Worker completed the psychosocial assessment, which can be found in the shadow chart. Patient will discharge home when medically stable. CSW and patient discussed several concerns of importance to him (noted in psychosocial assessment) and he was given an Assisted Living Facility list. CSW signing off, however please advise if any other needs surface that require clinical social work attention/intervention.  Genelle Bal, MSW, LCSW [phone]
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
## 2012-02-29 NOTE — ED Notes (Signed)
Patient currently resting quietly in bed; no respiratory or acute distress noted.  Patient updated on plan of care; informed patient that we are currently waiting on lab results to come back.  Patient has no other questions or concerns at this time; will continue to monitor. 

## 2012-02-29 NOTE — ED Notes (Signed)
Dr. Ethelda Chick asking about pending troponin i-stat results; asked lab about situation.  Lab tech states that they cannot find results and must redraw.  EDP notified and aware.  I-stat re-ordered.

## 2012-02-29 NOTE — ED Notes (Signed)
Hospitalist at bedside 

## 2012-02-29 NOTE — ED Notes (Signed)
Emptied urinal for patient.  Patient has no complaints at this time.  Patient inquiring about results of tests.  Advised him that we are waiting for the I-stat to be ran.

## 2012-03-01 LAB — CBC
HCT: 34.2 % — ABNORMAL LOW (ref 39.0–52.0)
Hemoglobin: 11.7 g/dL — ABNORMAL LOW (ref 13.0–17.0)
MCH: 30.3 pg (ref 26.0–34.0)
MCHC: 34.2 g/dL (ref 30.0–36.0)
RDW: 14.7 % (ref 11.5–15.5)

## 2012-03-01 LAB — PROTIME-INR: Prothrombin Time: 33.5 seconds — ABNORMAL HIGH (ref 11.6–15.2)

## 2012-03-01 LAB — BASIC METABOLIC PANEL
BUN: 14 mg/dL (ref 6–23)
Chloride: 101 mEq/L (ref 96–112)
Creatinine, Ser: 0.92 mg/dL (ref 0.50–1.35)
GFR calc Af Amer: 85 mL/min — ABNORMAL LOW (ref >90)
Glucose, Bld: 99 mg/dL (ref 70–99)
Potassium: 4 mEq/L (ref 3.5–5.1)

## 2012-03-01 MED ORDER — FENTANYL 25 MCG/HR TD PT72: 1.0000 | MEDICATED_PATCH | TRANSDERMAL | Status: DC

## 2012-03-01 NOTE — Progress Notes (Signed)
a  CARE MANAGEMENT NOTE 03/01/2012  Patient:  [name], [name]   Account Number:  [id number]  Date Initiated:  03/01/2012  Documentation initiated by:  Johny Shock  Subjective/Objective Assessment:   Order for [facility]     Action/Plan:   Met with pt who selected [facility] for [facility] needs   Anticipated DC Date:  03/01/2012   Anticipated DC Plan:  HOME W HOME HEALTH SERVICES         [facility] Choice  HOME HEALTH   Choice offered to / List presented to:  C-1 Patient        HH arranged  HH-1 RN  HH-2 PT      [facility] agency  [facility]   Status of service:  Completed, signed off Medicare Important Message given?   (If response is "NO", the following Medicare IM given date fields will be blank) Date Medicare IM given:   Date Additional Medicare IM given:    Discharge Disposition:  HOME W HOME HEALTH SERVICES  Per UR Regulation:    If discussed at Long Length of Stay Meetings, dates discussed:    Comments:
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
## 2012-03-02 ENCOUNTER — Telehealth: Payer: Self-pay | Admitting: *Deleted

## 2012-03-02 NOTE — Telephone Encounter (Signed)
[facility] called-they saw pt today for visit and pt has refused future services for home health visits-RN called to inform MD they will no longer be providing services for pt.

## 2012-03-03 NOTE — Progress Notes (Signed)
LATE ENTRY FOR 03/01/12: Patient requested and was given taxi voucher for transport home after being discharged.  Genelle Bal, MSW, LCSW [phone]

## 2012-03-07 ENCOUNTER — Ambulatory Visit (INDEPENDENT_AMBULATORY_CARE_PROVIDER_SITE_OTHER): Payer: Medicare Other

## 2012-03-07 DIAGNOSIS — Z7901 Long term (current) use of anticoagulants: Secondary | ICD-10-CM

## 2012-03-07 DIAGNOSIS — I4892 Unspecified atrial flutter: Secondary | ICD-10-CM

## 2012-03-07 DIAGNOSIS — Z8679 Personal history of other diseases of the circulatory system: Secondary | ICD-10-CM

## 2012-03-08 ENCOUNTER — Encounter: Payer: Self-pay | Admitting: Internal Medicine

## 2012-03-08 ENCOUNTER — Ambulatory Visit (INDEPENDENT_AMBULATORY_CARE_PROVIDER_SITE_OTHER): Payer: Medicare Other | Admitting: Internal Medicine

## 2012-03-08 VITALS — BP 118/70 | HR 80 | Temp 98.1°F | Resp 16 | Wt 194.0 lb

## 2012-03-08 DIAGNOSIS — R972 Elevated prostate specific antigen [PSA]: Secondary | ICD-10-CM

## 2012-03-08 NOTE — Progress Notes (Signed)
  Subjective:    Patient ID: Josiyah Tozzi, male    DOB: August 08, 1922, 78 y.o.   MRN: [id number]  HPI Mr. Elmore was recently hospitalized after an episode of profound weakness and fall. His evaluation was unremarkable for any acute problems and he was discharged to home. In the interval he declined [facility] PT/RN services. He has continued to have weakness. The extent of his metastatic prostate cancer is not well defined and he declines further diagnostic testing. He has declined strontium or bisphosphonate therapy. He is doing OK re: pain with duragesic 25 mcg patch.  He reports that he has been talking with [facility] doctor who feels hospice is reasonable as had been suggested at the last hospitalization. We discussed what hospice services are: particularly stressed the goal of remaining in his own home and avoiding return to hospital.   PMH, FamHx and SocHx reviewed for any changes and relevance.    Review of Systems System review is negative for any constitutional, cardiac, pulmonary, GI or neuro symptoms or complaints other than as described in the HPI.     Objective:   Physical Exam Filed Vitals:   03/08/12 1310  BP: 118/70  Pulse: 80  Temp: 98.1 F (36.7 C)  Resp: 16   Elderly white man in no acute distress HEENT- some pallor in the region of zygomas. C&S clear Pulm - no increased WOB Neuro.- A&O x 3.       Assessment & Plan:  (greater than 50% of 40 minute visit spent on education and counseling)

## 2012-03-08 NOTE — Assessment & Plan Note (Signed)
Patient with metastatic prostate cancer with progressive weakness and pain. After discussion as noted in HPI he is agreeable to Hospice services.  Called in referral - he will be seen at home march 20th at 11:00- he is aware and agreeable. Goal is to keep at home, avoid hospitalization.

## 2012-03-10 ENCOUNTER — Telehealth: Payer: Self-pay

## 2012-03-10 NOTE — Telephone Encounter (Signed)
Returned call to Sarah//LMOVM advising per MD

## 2012-03-10 NOTE — Telephone Encounter (Signed)
Sarah with Hospice [phone]) called to follow up on orders for pain control for patient. She states that MD spoke with another nurse Lupita Leash on wed 03/09/12 and advised that he would call and discuss with patient. This am patient c/o continued pain (# 8 pain scale), also pain is causing him not to be able to get up and down. They are not sure if MD has spoken with patient, please advise on pain control Thanks

## 2012-03-10 NOTE — Telephone Encounter (Signed)
Hospice messages received. Called patient- he does have increased pain. We discussed increasing medications and agree to increasing the fentanyl to 50 mcg (can use two patches at a time until gone) and will wait and see if it will be necessary to add methadone 2.5 mg tid as recommended by Dr. Delanna Notice.   Please inform Hospice.

## 2012-03-15 ENCOUNTER — Telehealth: Payer: Self-pay | Admitting: *Deleted

## 2012-03-15 NOTE — Telephone Encounter (Signed)
1. Ok for Dr. Delanna Notice to sign DNR 2. OK for fentanyl patch Rx - hospice patient, they may accept faxed Rx - if need let me know to generate it.

## 2012-03-15 NOTE — Telephone Encounter (Signed)
Hospice would like to know if you would approve Dr Delanna Notice at their facility to sign DNR [place] for patient to have at home. Also, they are requesting new Rx for patient's Fentanyl patch 50 mcg to [facility].

## 2012-03-16 MED ORDER — FENTANYL 50 MCG/HR TD PT72: 1.0000 | MEDICATED_PATCH | TRANSDERMAL | Status: DC

## 2012-03-16 NOTE — Telephone Encounter (Signed)
LMOM for [name] w/Hospice that VO has been given for Dr Delanna Notice to sign DNR; also, that Fentanyl Rx printed awaiting MD signature for faxing to patient's pharmacy. Left contact name & number for call back if needed.

## 2012-03-16 NOTE — Telephone Encounter (Signed)
Faxed to pharmacy [facility].

## 2012-03-21 ENCOUNTER — Ambulatory Visit (INDEPENDENT_AMBULATORY_CARE_PROVIDER_SITE_OTHER): Payer: Medicare Other | Admitting: Pharmacist

## 2012-03-21 DIAGNOSIS — Z8679 Personal history of other diseases of the circulatory system: Secondary | ICD-10-CM

## 2012-03-21 DIAGNOSIS — I4892 Unspecified atrial flutter: Secondary | ICD-10-CM

## 2012-03-21 DIAGNOSIS — Z7901 Long term (current) use of anticoagulants: Secondary | ICD-10-CM

## 2012-03-21 MED ORDER — WARFARIN SODIUM 5 MG PO TABS: ORAL_TABLET | ORAL | Status: DC

## 2012-03-23 DIAGNOSIS — C7952 Secondary malignant neoplasm of bone marrow: Secondary | ICD-10-CM

## 2012-03-23 DIAGNOSIS — C7951 Secondary malignant neoplasm of bone: Secondary | ICD-10-CM

## 2012-03-23 DIAGNOSIS — C61 Malignant neoplasm of prostate: Secondary | ICD-10-CM

## 2012-03-28 ENCOUNTER — Encounter: Payer: Self-pay | Admitting: Internal Medicine

## 2012-03-28 ENCOUNTER — Inpatient Hospital Stay (HOSPITAL_COMMUNITY)
Admission: AD | Admit: 2012-03-28 | Discharge: 2012-04-01 | DRG: 641 | Disposition: A | Discharge status: Hospice | Source: Ambulatory Visit | Attending: Internal Medicine | Admitting: Internal Medicine

## 2012-03-28 ENCOUNTER — Encounter (HOSPITAL_COMMUNITY): Payer: Self-pay | Admitting: *Deleted

## 2012-03-28 ENCOUNTER — Ambulatory Visit (INDEPENDENT_AMBULATORY_CARE_PROVIDER_SITE_OTHER): Payer: Medicare Other | Admitting: Internal Medicine

## 2012-03-28 ENCOUNTER — Observation Stay (HOSPITAL_COMMUNITY)

## 2012-03-28 VITALS — BP 122/64 | HR 62 | Temp 98.5°F | Resp 16 | Wt 187.5 lb

## 2012-03-28 DIAGNOSIS — R972 Elevated prostate specific antigen [PSA]: Secondary | ICD-10-CM | POA: Diagnosis present

## 2012-03-28 DIAGNOSIS — C4491 Basal cell carcinoma of skin, unspecified: Secondary | ICD-10-CM | POA: Diagnosis present

## 2012-03-28 DIAGNOSIS — C7951 Secondary malignant neoplasm of bone: Secondary | ICD-10-CM | POA: Diagnosis present

## 2012-03-28 DIAGNOSIS — I509 Heart failure, unspecified: Secondary | ICD-10-CM | POA: Diagnosis present

## 2012-03-28 DIAGNOSIS — N401 Enlarged prostate with lower urinary tract symptoms: Secondary | ICD-10-CM | POA: Diagnosis present

## 2012-03-28 DIAGNOSIS — K219 Gastro-esophageal reflux disease without esophagitis: Secondary | ICD-10-CM | POA: Diagnosis present

## 2012-03-28 DIAGNOSIS — R5381 Other malaise: Secondary | ICD-10-CM

## 2012-03-28 DIAGNOSIS — C7952 Secondary malignant neoplasm of bone marrow: Secondary | ICD-10-CM | POA: Diagnosis present

## 2012-03-28 DIAGNOSIS — R627 Adult failure to thrive: Principal | ICD-10-CM | POA: Diagnosis present

## 2012-03-28 DIAGNOSIS — R350 Frequency of micturition: Secondary | ICD-10-CM | POA: Diagnosis present

## 2012-03-28 DIAGNOSIS — M1990 Unspecified osteoarthritis, unspecified site: Secondary | ICD-10-CM | POA: Diagnosis present

## 2012-03-28 DIAGNOSIS — R791 Abnormal coagulation profile: Secondary | ICD-10-CM | POA: Diagnosis present

## 2012-03-28 DIAGNOSIS — Z79899 Other long term (current) drug therapy: Secondary | ICD-10-CM

## 2012-03-28 DIAGNOSIS — Z96649 Presence of unspecified artificial hip joint: Secondary | ICD-10-CM

## 2012-03-28 DIAGNOSIS — I4891 Unspecified atrial fibrillation: Secondary | ICD-10-CM | POA: Diagnosis present

## 2012-03-28 DIAGNOSIS — R259 Unspecified abnormal involuntary movements: Secondary | ICD-10-CM | POA: Diagnosis present

## 2012-03-28 DIAGNOSIS — Z8711 Personal history of peptic ulcer disease: Secondary | ICD-10-CM

## 2012-03-28 DIAGNOSIS — I5030 Unspecified diastolic (congestive) heart failure: Secondary | ICD-10-CM | POA: Diagnosis present

## 2012-03-28 DIAGNOSIS — C801 Malignant (primary) neoplasm, unspecified: Secondary | ICD-10-CM | POA: Diagnosis present

## 2012-03-28 DIAGNOSIS — K5900 Constipation, unspecified: Secondary | ICD-10-CM | POA: Diagnosis present

## 2012-03-28 DIAGNOSIS — N390 Urinary tract infection, site not specified: Secondary | ICD-10-CM | POA: Diagnosis present

## 2012-03-28 DIAGNOSIS — J309 Allergic rhinitis, unspecified: Secondary | ICD-10-CM | POA: Diagnosis present

## 2012-03-28 DIAGNOSIS — Z9181 History of falling: Secondary | ICD-10-CM

## 2012-03-28 DIAGNOSIS — F05 Delirium due to known physiological condition: Secondary | ICD-10-CM | POA: Diagnosis not present

## 2012-03-28 DIAGNOSIS — Z87828 Personal history of other (healed) physical injury and trauma: Secondary | ICD-10-CM

## 2012-03-28 DIAGNOSIS — Z7901 Long term (current) use of anticoagulants: Secondary | ICD-10-CM

## 2012-03-28 DIAGNOSIS — R413 Other amnesia: Secondary | ICD-10-CM | POA: Diagnosis present

## 2012-03-28 DIAGNOSIS — R269 Unspecified abnormalities of gait and mobility: Secondary | ICD-10-CM | POA: Diagnosis present

## 2012-03-28 DIAGNOSIS — C61 Malignant neoplasm of prostate: Secondary | ICD-10-CM | POA: Diagnosis present

## 2012-03-28 DIAGNOSIS — R531 Weakness: Secondary | ICD-10-CM

## 2012-03-28 DIAGNOSIS — N138 Other obstructive and reflux uropathy: Secondary | ICD-10-CM | POA: Diagnosis present

## 2012-03-28 DIAGNOSIS — Z66 Do not resuscitate: Secondary | ICD-10-CM | POA: Diagnosis present

## 2012-03-28 DIAGNOSIS — Z515 Encounter for palliative care: Secondary | ICD-10-CM

## 2012-03-28 DIAGNOSIS — I2510 Atherosclerotic heart disease of native coronary artery without angina pectoris: Secondary | ICD-10-CM | POA: Diagnosis present

## 2012-03-28 LAB — CBC
MCH: 30 pg (ref 26.0–34.0)
MCHC: 33.3 g/dL (ref 30.0–36.0)
Platelets: 232 10*3/uL (ref 150–400)
RDW: 14.4 % (ref 11.5–15.5)

## 2012-03-28 LAB — COMPREHENSIVE METABOLIC PANEL
ALT: 16 U/L (ref 0–53)
Alkaline Phosphatase: 234 U/L — ABNORMAL HIGH (ref 39–117)
CO2: 33 mEq/L — ABNORMAL HIGH (ref 19–32)
GFR calc Af Amer: 84 mL/min — ABNORMAL LOW (ref >90)
GFR calc non Af Amer: 73 mL/min — ABNORMAL LOW (ref >90)
Glucose, Bld: 118 mg/dL — ABNORMAL HIGH (ref 70–99)
Potassium: 4.9 mEq/L (ref 3.5–5.1)
Sodium: 137 mEq/L (ref 135–145)
Total Bilirubin: 0.3 mg/dL (ref 0.3–1.2)

## 2012-03-28 LAB — DIFFERENTIAL
Basophils Absolute: 0 10*3/uL (ref 0.0–0.1)
Basophils Relative: 0 % (ref 0–1)
Eosinophils Absolute: 0.1 10*3/uL (ref 0.0–0.7)
Monocytes Relative: 15 % — ABNORMAL HIGH (ref 3–12)
Neutrophils Relative %: 65 % (ref 43–77)

## 2012-03-28 LAB — PROTIME-INR
INR: 6.12 (ref 0.00–1.49)
Prothrombin Time: 55.2 seconds — ABNORMAL HIGH (ref 11.6–15.2)

## 2012-03-28 LAB — CARDIAC PANEL(CRET KIN+CKTOT+MB+TROPI): Troponin I: <0.3 ng/mL (ref <0.30)

## 2012-03-28 MED ORDER — DOXAZOSIN MESYLATE 2 MG PO TABS
2.0000 mg | ORAL_TABLET | Freq: Every day | ORAL | Status: DC
  Administered 2012-03-28: 2 mg via ORAL
  Filled 2012-03-28 (×3): qty 1

## 2012-03-28 MED ORDER — PROMETHAZINE HCL 25 MG PO TABS
12.5000 mg | ORAL_TABLET | Freq: Four times a day (QID) | ORAL | Status: DC | PRN
  Administered 2012-03-29 – 2012-03-31 (×2): 12.5 mg via ORAL
  Filled 2012-03-28 (×3): qty 1

## 2012-03-28 MED ORDER — ASPIRIN EC 81 MG PO TBEC
81.0000 mg | DELAYED_RELEASE_TABLET | Freq: Every day | ORAL | Status: DC
  Administered 2012-03-28 – 2012-04-01 (×4): 81 mg via ORAL
  Filled 2012-03-28 (×5): qty 1

## 2012-03-28 MED ORDER — FENTANYL 50 MCG/HR TD PT72
50.0000 ug | MEDICATED_PATCH | TRANSDERMAL | Status: DC
  Administered 2012-03-28: 50 ug via TRANSDERMAL
  Filled 2012-03-28: qty 1

## 2012-03-28 MED ORDER — WARFARIN SODIUM 5 MG PO TABS: 5.0000 mg | ORAL_TABLET | Freq: Every day | ORAL | Status: DC

## 2012-03-28 MED ORDER — WARFARIN - PHYSICIAN DOSING INPATIENT: Freq: Every day | Status: DC

## 2012-03-28 MED ORDER — FINASTERIDE 5 MG PO TABS
5.0000 mg | ORAL_TABLET | Freq: Every day | ORAL | Status: DC
  Administered 2012-03-28 – 2012-04-01 (×5): 5 mg via ORAL
  Filled 2012-03-28 (×5): qty 1

## 2012-03-28 MED ORDER — ZOLPIDEM TARTRATE 5 MG PO TABS: 5.0000 mg | ORAL_TABLET | Freq: Every evening | ORAL | Status: DC | PRN

## 2012-03-28 MED ORDER — AMIODARONE HCL 100 MG PO TABS
50.0000 mg | ORAL_TABLET | Freq: Every day | ORAL | Status: DC
  Administered 2012-03-28 – 2012-04-01 (×5): 50 mg via ORAL
  Filled 2012-03-28 (×6): qty 1

## 2012-03-28 MED ORDER — SODIUM CHLORIDE 0.9 % IV SOLN
INTRAVENOUS | Status: DC
  Administered 2012-03-28 – 2012-04-01 (×6): via INTRAVENOUS

## 2012-03-28 MED ORDER — WARFARIN - PHARMACIST DOSING INPATIENT: Freq: Every day | Status: DC

## 2012-03-28 MED ORDER — SODIUM CHLORIDE 0.9 % IJ SOLN
3.0000 mL | Freq: Two times a day (BID) | INTRAMUSCULAR | Status: DC
  Administered 2012-03-28: 3 mL via INTRAVENOUS

## 2012-03-28 MED ORDER — PANTOPRAZOLE SODIUM 40 MG PO TBEC
40.0000 mg | DELAYED_RELEASE_TABLET | Freq: Every day | ORAL | Status: DC
  Administered 2012-03-28 – 2012-04-01 (×5): 40 mg via ORAL
  Filled 2012-03-28 (×5): qty 1

## 2012-03-28 MED ORDER — BISACODYL 5 MG PO TBEC: 5.0000 mg | DELAYED_RELEASE_TABLET | Freq: Every day | ORAL | Status: DC | PRN

## 2012-03-28 MED ORDER — ACETAMINOPHEN 500 MG PO TABS: 500.0000 mg | ORAL_TABLET | Freq: Four times a day (QID) | ORAL | Status: DC | PRN

## 2012-03-28 NOTE — Progress Notes (Signed)
CRITICAL VALUE ALERT  Critical value received:  INR 6.12  Date of notification:  03/28/12  Time of notification:  2020  Critical value read back:yes  Nurse who received alert:  Joya Gaskins  MD notified (1st page):  Dr Rene Paci  Time of first page:  2040  MD notified (2nd page):  Time of second page:  Responding MD:  Dr Rene Paci  Time MD responded:  2100

## 2012-03-28 NOTE — Progress Notes (Signed)
Patient JX:BJYNWG [name]      DOB: 1922-02-17      NFA:[id number]  Consult to assist with symptom management requested. Discussed with Dr. [name] Will work with the hospice team in am to see patient as soon as possible.  If symptoms are not well managed please do not hesitate to call in the interim ([phone].  [name] L. Ladona Ridgel, MD MBA The Palliative Medicine Team at [facility] Phone: [phone] Pager: [phone]

## 2012-03-28 NOTE — Progress Notes (Addendum)
ANTICOAGULATION CONSULT NOTE - Initial Consult  Pharmacy Consult for Warfarin Indication: atrial fibrillation  Allergies  Allergen Reactions  . Sulfonamide Derivatives Other (See Comments)    "ended up in the hospital"  . Percocet (Oxycodone-Acetaminophen) Nausea And Vomiting    Patient Measurements: Height: 6' (182.9 cm) Weight: 187 lb (84.823 kg) IBW/kg (Calculated) : 77.6   Vital Signs: Temp: 98.1 F (36.7 C) (04/08 1855) Temp src: Oral (04/08 1855) BP: 155/69 mmHg (04/08 1855) Pulse Rate: 67  (04/08 1855)  Labs: No results found for this basename: HGB:2,HCT:3,PLT:3,APTT:3,LABPROT:3,INR:3,HEPARINUNFRC:3,CREATININE:3,CKTOTAL:3,CKMB:3,TROPONINI:3 in the last 72 hours Estimated Creatinine Clearance: 60.9 ml/min (by C-G formula based on Cr of 0.92).  Medical History: Past Medical History  Diagnosis Date  . Atrial fibrillation 2002     Paroxysmal. Episode in 2002 associated with syncope.  No documented recurrence until 2/12. Event monitor 2/12-3/12 with occasional runs of atrial fibrillation with rapid response.  He also had pauses up to 3.5 seconds while in atrial fibrillation, suggesting tachy-brady syndrome.  . Elevated prostate specific antigen (PSA)     presumed met prostate cancer (PSA >400, RP LAD on CT 8/12 and verteb mets, declined bx)  . Allergic rhinitis, cause unspecified   . Osteoarthrosis, unspecified whether generalized or localized, lower leg   . Coronary atherosclerosis of unspecified type of vessel, native or graft     75-80% distal LAD stenosis, medically managed.    . Lower extremity edema     chronic  . Gastric ulcer, unspecified as acute or chronic, without mention of hemorrhage, perforation, or obstruction   . Calculus of gallbladder without mention of cholecystitis or obstruction 2005  . Carbuncle and furuncle of unspecified site   . GERD (gastroesophageal reflux disease)   . Cancer     prostate cancer    Assessment: 89yom admitted with  increased weakness, nausea and vomiting over the past 48 hours in setting of probably metastatic prostate cancer.  On chronic coumadin for atrial fibrillation PTA.  Home dose 2.5 mg daily except takes 5 mg on Sundays and is followed by [facility].  Patient reports that he has already taken his warfarin dose today.    Goal of Therapy:  INR 2-3   Plan:  F/u STAT INR ordered to determine if patient will require extra dose of warfarin today.  Clance Boll 03/28/2012,7:03 PM   ADDENDUM: 03/28/2012 8:21 PM INR 6.12 A: Patient has been compliant with taking his warfarin doses but has had nausea and vomiting over the past 48 hours which may and did result in an increase in INR. P: Hold coumadin for now and f/u INR daily.  Clance Boll, PharmD, BCPS Pager: [phone] 03/28/2012 8:22 PM
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
## 2012-03-29 ENCOUNTER — Observation Stay (HOSPITAL_COMMUNITY)

## 2012-03-29 LAB — URINALYSIS, ROUTINE W REFLEX MICROSCOPIC
Bilirubin Urine: NEGATIVE
Ketones, ur: NEGATIVE mg/dL
Nitrite: NEGATIVE
Urobilinogen, UA: 0.2 mg/dL (ref 0.0–1.0)

## 2012-03-29 LAB — CARDIAC PANEL(CRET KIN+CKTOT+MB+TROPI)
CK, MB: 2.9 ng/mL (ref 0.3–4.0)
CK, MB: 3.3 ng/mL (ref 0.3–4.0)
Relative Index: 2.8 — ABNORMAL HIGH (ref 0.0–2.5)
Total CK: 119 U/L (ref 7–232)
Troponin I: <0.3 ng/mL (ref <0.30)
Troponin I: <0.3 ng/mL (ref <0.30)

## 2012-03-29 MED ORDER — LORAZEPAM 0.5 MG PO TABS: 0.5000 mg | ORAL_TABLET | ORAL | Status: DC | PRN

## 2012-03-29 MED ORDER — CIPROFLOXACIN IN D5W 400 MG/200ML IV SOLN
400.0000 mg | Freq: Two times a day (BID) | INTRAVENOUS | Status: DC
  Administered 2012-03-29: 400 mg via INTRAVENOUS
  Filled 2012-03-29 (×3): qty 200

## 2012-03-29 MED ORDER — MAGNESIUM HYDROXIDE 400 MG/5ML PO SUSP
30.0000 mL | ORAL | Status: DC
  Administered 2012-03-29 – 2012-03-30 (×6): 30 mL via ORAL
  Filled 2012-03-29 (×6): qty 30

## 2012-03-29 MED ORDER — DOXAZOSIN MESYLATE 4 MG PO TABS
4.0000 mg | ORAL_TABLET | Freq: Every day | ORAL | Status: DC
  Administered 2012-03-29 – 2012-03-31 (×3): 4 mg via ORAL
  Filled 2012-03-29 (×5): qty 1
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
## 2012-03-29 NOTE — Progress Notes (Signed)
Subjective: Patient is sitting on side of bed and feels better. Weakness and shakiness seem better. No further emesis.   Objective: Lab: Lab Results  Component Value Date   WBC 5.6 03/28/2012   HGB 11.9* 03/28/2012   HCT 35.7* 03/28/2012   MCV 89.9 03/28/2012   PLT 232 03/28/2012   BMET    Component Value Date/Time   NA 137 03/28/2012 1950   K 4.9 03/28/2012 1950   CL 100 03/28/2012 1950   CO2 33* 03/28/2012 1950   GLUCOSE 118* 03/28/2012 1950   BUN 15 03/28/2012 1950   CREATININE 0.93 03/28/2012 1950   CALCIUM 9.0 03/28/2012 1950   GFRNONAA 73* 03/28/2012 1950   GFRAA 84* 03/28/2012 1950        TSH                       0.83                                                                 03/28/2012  1950  Cardiac Panel (last 3 results)  Basename 03/29/12 0325 03/28/12 1950  CKTOTAL 108 129  CKMB 2.9 3.3  TROPONINI <0.30 <0.30  RELINDX 2.7* 2.6*   12 lead EKG 4/8 - sinus bradycardia w/ 1st degree AVB   Imaging: CXR 4/8 IMPRESSION:  Findings suggestive of sclerotic metastatic disease.  Nodular density anterior aspect of the left first rib may be  related to the rib. Pulmonary nodules not excluded.  Central pulmonary vascular prominence without pulmonary edema or  segmental infiltrate.  Tortuous aorta.    Physical Exam: Filed Vitals:   03/29/12 0500  BP: 148/66  Pulse: 54  Temp: 98.2 F (36.8 C)  Resp: 20  sitting up on the side of the bed - reports that he is feeling better HEENT- C&S clear Cor- 2+ radial, regular rate, soft II/VI mm RSB Pulm - good BS, no rales or wheezes Abd- BS+ Neuro - A&O x 3, MS good - able to sit w/o assistance.     Assessment/Plan: 1. NV & weakness - labs are normal. He is feeling better after hydration. Question of acute viral illness Plan - continue IVF for now  2. Atrial fib- 12 lead EKG and tele reveal sinus brady. Plan - continue home meds  3. Onc- metabolically stable. Chest x-ray with probable sclerotic lesions. Pain does seem controlled Plan  - palliative care consult to review pain meds for efficacy and minimizing side effects            PT evaluation - safety re: continuing to live alone  4. GI - no further N/V.  Plan - continue PPI  Dispo- hopefully early d/c after consults complete.    Illene Regulus 03/29/2012, 6:58 AM

## 2012-03-29 NOTE — Progress Notes (Signed)
CARE MANAGEMENT NOTE 03/29/2012  Patient:  [name], [name]   Account Number:  [id number]  Date Initiated:  03/29/2012  Documentation initiated by:  Colleen Can  Subjective/Objective Assessment:   dx weaknes, nausea & vomiting/hx ca basal cell     Action/Plan:   CM spoke with patient. Patient states he lives alone and was active with [facility] prior to admission to hospital. States he has son and daughter that live out of state.   Anticipated DC Date:  03/29/2012   Anticipated DC Plan:  HOME W HOME HEALTH SERVICES  In-house referral  Hospice / Palliative Care      DC Planning Services  CM consult      Choice offered to / List presented to:             Status of service:  In process, will continue to follow Medicare Important Message given?   (If response is "NO", the following Medicare IM given date fields will be blank) Comments:  04/09/201# Raynelle Bring BSN CCM [phone] Pt states he is currently unsure of what he plans to do when discharged . Voices that he plans to talk to attending MD today. [facility] has been notified of patient's admission to hospital. Palliative care consult and PT eval in place per chart review. CM will follow as needed. Marland Kitchen

## 2012-03-29 NOTE — Progress Notes (Signed)
Rm 1435     [name].        [facility] SW note:  Spoke with pt about how safe he feels living alone. Pt is undecided. He feels that his home environment is safe but he is uncertain about weakness and if he will be able to independently attend to his own care. He has the means to hire in home help. He intends to think more about his disposition. Pt has not spoken with his daughter about this admission and does not want her to be contacted. He does not want to worry her and will call her on his own. Will continue to follow to check pt's status and support as needed. Exie Parody Shon Hough, LCSWA

## 2012-03-29 NOTE — Progress Notes (Signed)
  Pharmacy Note (Brief) - Warfarin Indication Afib  No INR checked this am, was 6.19 at 19:50 last night. Possible discharge soon (?) per MD note, so will plan to check INR in am (if still here). Plan will be to resume warfarin dosing once INR < 3.  Darrol Angel, PharmD Pager: [phone] 03/29/2012 1:05 PM

## 2012-03-29 NOTE — Progress Notes (Signed)
Room 1435 - [name].    [facility]  [facility] RN Visit  Related admission to [facility] diagnosis of prostate cancer.  Pt is DNR code.    Pt alert, appears oriented to self, & time. Has difficulty concentrating on conversation-states he wants to get to [facility] because this mattress is hurting his back.  Admits LBM is over 3 days ago and he has issues with constipation.   Pt sitting up in bed, with complaints of nausea.   No family present.    Pt has been seen by PMT Dr. Derenda Mis for symptom management.   Please call [facility] @ [phone] with any needs.  Thank you.  Joneen Boers, RN  [facility]  Hospice Liaison

## 2012-03-29 NOTE — Progress Notes (Signed)
Subjective: Complex presentation and discussion. He is feeling bad and that he is rapidly deteriorating. He has trouble recovering from episodes of nausea, discomfort and confusion. He has more frequent episodes of feeling cold and he has had increased tremor and involuntary movement. He admits to hallucinations especially when awakening or recovering from a weak spell.  Discussed strategies for diagnosis and treatment. He does not want a lot of testing; he does want comfort care. He does not feel he can manage on his own. He feels he is actively dying.  Objective: Lab: no new lab  Imaging:no new imaging   Physical Exam: Filed Vitals:   03/29/12 1306  BP: 167/68  Pulse: 55  Temp: 98.2 F (36.8 C)  Resp: 20    Intake/Output Summary (Last 24 hours) at 03/29/12 1939 Last data filed at 03/29/12 1837  Gross per 24 hour  Intake 2962.58 ml  Output   1325 ml  Net 1637.58 ml   Gen'l - elderly white man looking increasingly weak.  Abd- distended. He has eructation and nause     Assessment/Plan: 1. N/V and weakness - unclear as to cause: may be constipation. INR was 6 - question of occult bleeding. Possible occult infection - bladder with poor emptying due to enlarged prostate. He does have chills and early delerium Plan - portable KUB            Start Milk of Mag 30 cc q 4 until BM or 3 doses, then daily dose of 15 cc            U/A            Increase cardura to 4 mg             Empirically start cipro             Ativan for agitation/anxiety/hallucinations  2. Cardiac/a.fib - EKG was Sinus brady. He is hemodynamically stable Plan - d/c/ tele            D/c coumadin - too high fall risk  3. ONC- he declines bone scan. Suspect he has rapidly progressing metastatic tumor burden. Possible CNS mets. He clearly prefers comfort care to any big work up. Although he did well with PT he feels increasingly weak and vulnerable. Plan - stop all unesssential meds            Request for  [facility] admission   Illene Regulus 03/29/2012, 7:35 PM

## 2012-03-29 NOTE — Progress Notes (Signed)
  Subjective:    Patient ID: [name], male    DOB: 11-23-1922, 78 y.o.   MRN: [id number]  HPI  Patient admitted to hospital. See H&P  Review of Systems     Objective:   Physical Exam        Assessment & Plan:
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
## 2012-03-30 MED ORDER — FENTANYL 50 MCG/HR TD PT72
50.0000 ug | MEDICATED_PATCH | TRANSDERMAL | Status: DC
  Administered 2012-03-30: 50 ug via TRANSDERMAL
  Filled 2012-03-30: qty 1

## 2012-03-30 MED ORDER — CIPROFLOXACIN HCL 250 MG PO TABS
250.0000 mg | ORAL_TABLET | Freq: Two times a day (BID) | ORAL | Status: DC
  Administered 2012-03-30 – 2012-04-01 (×5): 250 mg via ORAL
  Filled 2012-03-30 (×6): qty 1

## 2012-03-30 MED ORDER — SENNOSIDES-DOCUSATE SODIUM 8.6-50 MG PO TABS
2.0000 | ORAL_TABLET | Freq: Every day | ORAL | Status: DC
  Administered 2012-03-30: 2 via ORAL
  Filled 2012-03-30 (×3): qty 2

## 2012-03-30 MED ORDER — ENSURE COMPLETE PO LIQD
237.0000 mL | Freq: Two times a day (BID) | ORAL | Status: DC
  Administered 2012-03-30 – 2012-03-31 (×3): 237 mL via ORAL

## 2012-03-30 MED ORDER — POLYETHYLENE GLYCOL 3350 17 G PO PACK
17.0000 g | PACK | Freq: Two times a day (BID) | ORAL | Status: DC
  Administered 2012-03-30 (×2): 17 g via ORAL
  Filled 2012-03-30 (×6): qty 1

## 2012-03-30 NOTE — Progress Notes (Signed)
Subjective: C/o freq  Urination Appetite ok Still contipation  Objective: Vital signs in last 24 hours: Temp:  [98.1 F (36.7 C)-98.7 F (37.1 C)] 98.7 F (37.1 C) (04/10 0457) Pulse Rate:  [55-66] 66  (04/10 0457) Resp:  [19-20] 19  (04/10 0457) BP: (167-177)/(68-88) 169/88 mmHg (04/10 0457) SpO2:  [95 %-96 %] 96 % (04/10 0457) Weight change:  Last BM Date: 03/25/12  Intake/Output from previous day: 04/09 0701 - 04/10 0700 In: 3435.8 [P.O.:440; I.V.:2995.8] Out: 2900 [Urine:2900] Intake/Output this shift:    General appearance: alert Cardio: regular rate and rhythm GI: soft, non-tender; bowel sounds normal; no masses,  no organomegaly  Lab Results:  [facility] 03/28/12 1950  WBC 5.6  HGB 11.9*  HCT 35.7*  PLT 232   BMET  Basename 03/28/12 1950  NA 137  K 4.9  CL 100  CO2 33*  GLUCOSE 118*  BUN 15  CREATININE 0.93  CALCIUM 9.0    Studies/Results: X-ray Chest Pa And Lateral   03/29/2012  *RADIOLOGY REPORT*  Clinical Data: Weakness. Shortness of breath.  Rule out effusion.  CHEST - 2 VIEW  Comparison: 04/29/2004.  Findings: Calcified tortuous aorta.  Top normal sized heart.  Central pulmonary vascular prominence. No infiltrate, congestive heart failure or pneumothorax.  Nodularity anterior aspect of the left first rib (possibly related to the rib rather than the lung nodule).  Scattered small sclerotic foci throughout several bony structures with sclerosis of the T10 vertebra.  Findings raise possibility of sclerotic metastatic disease.  IMPRESSION: Findings suggestive of sclerotic metastatic disease.  Nodular density anterior aspect of the left first rib may be related to the rib.  Pulmonary nodules not excluded.  Central pulmonary vascular prominence without pulmonary edema or segmental infiltrate.  Tortuous aorta.  Original Report Authenticated By: Fuller Canada, M.D.   Dg Abd 1 View  03/29/2012  *RADIOLOGY REPORT*  Clinical Data: Assess for constipation or  ascites.  ABDOMEN - 1 VIEW  Comparison: CT of the abdomen and pelvis performed 07/31/2011, and abdominal radiograph performed 07/28/2011  Findings: Evaluation for intraperitoneal or retroperitoneal bleed is not possible on abdominal radiograph.  There is no evidence of displacement of bowel to suggest significant ascites.  Scattered stool is noted throughout the colon, without significant constipation.  The visualized bowel gas pattern is unremarkable.  Scattered air and stool filled loops of colon are seen; no abnormal dilatation of small bowel loops is seen to suggest small bowel obstruction.  No free intra-abdominal air is identified, though evaluation for free air is limited on a single supine view.  Mild superior joint space narrowing is noted at the left hip, with some degree of bony remodelling.  The visualized portions of the right hip total arthroplasty are grossly unremarkable.  Scattered areas of increased density are noted throughout the visualized osseous structures, compatible with sclerotic metastatic disease.  This is particularly prominent at the iliac wings, more prominent on the left, and along the lower thoracic and lumbar spine.  IMPRESSION:  1.  Unremarkable bowel gas pattern; no free intra-abdominal air seen. 2.  No evidence of significant constipation. 3.  No evidence of displacement of bowel to suggest significant ascites. 4.  Intraperitoneal or retroperitoneal bleed would not be visible on abdominal radiograph; if there is significant clinical concern for bleed, CT of the abdomen and pelvis may be considered for further evaluation. 5.  Scattered areas of increased density noted throughout the visualized osseous structures, compatible with sclerotic metastatic disease.  Original Report Authenticated  By: JEFFREY Cherly Hensen, M.D.    Medications: I have reviewed the patient's current medications.  Assessment/Plan: Metastatic prostate CA Palliative care [facility] or SNF- with hospice Pain  control ok UTI change to PO cipro Afib - off coumadin BP ok Constipation- miralax And sennokot Social work for placement.  LOS: 2 days   [name] 03/30/2012, 7:32 AM

## 2012-03-30 NOTE — Progress Notes (Signed)
Room 1435 - [place]  [facility]  [facility] RN Visit  Related admission to [facility] diagnosis of prostate cancer.   Pt is DNR code.    Pt alert & oriented, sitting up in bed, with no complaints of pain or discomfort.    No family present.  Pt states he is feeling better, no nausea at present and feels like he has made a decision to go to ALF.  He will continue to discuss with daughter in [place] this pm.  Hoping to be discharged tomorrow 4/11 to ALF.  Pt asking this Clinical research associate and [facility] SW for recommendations for ALF.  He will continue to discuss with hospital SW [place].     Please call [facility] @ [phone] with any needs.  Thank you.  Joneen Boers, RN  [facility]  Hospice Liaison

## 2012-03-30 NOTE — Progress Notes (Signed)
Rm 1435          [name].             [facility] SW note:  Pt has decided that he will not be able to return home following discharge. He is considering AL as he desires to live in an apartment/private living environment where help will be available. He will be paying out of pocket. He is also interested in having hospice continue to follow. He has spoken with his son in law [name] regarding his plans. This SW received VM from hospital CSW Doreen Salvage regarding discharge plans. Will contact CSW to further discuss plans. Exie Parody Shon Hough, LCSWA

## 2012-03-30 NOTE — Plan of Care (Signed)
Problem: Phase I Progression Outcomes Goal: Initial discharge plan identified Outcome: Completed/Met Date Met:  03/30/12 Pt will be discharged home with palliative care

## 2012-03-30 NOTE — Progress Notes (Signed)
Physical Therapy Note  Order received. Note SW stating they are awaiting PT eval. Eval was completed on 03/29/12. Thanks. Rebeca Alert, PT  [phone]

## 2012-03-30 NOTE — Progress Notes (Signed)
INITIAL ADULT NUTRITION ASSESSMENT Date: 03/30/2012   Time: 1:29 PM Reason for Assessment: Screened for nutrition risk for unintentional weight loss of >10 lb over 1 month.   ASSESSMENT: Male 78 y.o.  Dx: Increased weakness, nausea and vomiting  Hx:  Past Medical History  Diagnosis Date  . Atrial fibrillation 2002     Paroxysmal. Episode in 2002 associated with syncope.  No documented recurrence until 2/12. Event monitor 2/12-3/12 with occasional runs of atrial fibrillation with rapid response.  He also had pauses up to 3.5 seconds while in atrial fibrillation, suggesting tachy-brady syndrome.  . Elevated prostate specific antigen (PSA)     presumed met prostate cancer (PSA >400, RP LAD on CT 8/12 and verteb mets, declined bx)  . Allergic rhinitis, cause unspecified   . Osteoarthrosis, unspecified whether generalized or localized, lower leg   . Coronary atherosclerosis of unspecified type of vessel, native or graft     75-80% distal LAD stenosis, medically managed.    . Lower extremity edema     chronic  . Gastric ulcer, unspecified as acute or chronic, without mention of hemorrhage, perforation, or obstruction   . Calculus of gallbladder without mention of cholecystitis or obstruction 2005  . Carbuncle and furuncle of unspecified site   . GERD (gastroesophageal reflux disease)   . Cancer     prostate cancer    Related Meds:  Scheduled Meds:   . amiodarone  50 mg Oral Daily  . aspirin EC  81 mg Oral Daily  . ciprofloxacin  250 mg Oral BID  . doxazosin  4 mg Oral QHS  . fentaNYL  50 mcg Transdermal Q72H  . finasteride  5 mg Oral Daily  . magnesium hydroxide  30 mL Oral Q4H  . pantoprazole  40 mg Oral Q1200  . polyethylene glycol  17 g Oral BID  . senna-docusate  2 tablet Oral QHS  . sodium chloride  3 mL Intravenous Q12H  . DISCONTD: ciprofloxacin  400 mg Intravenous Q12H  . DISCONTD: doxazosin  2 mg Oral QHS  . DISCONTD: fentaNYL  50 mcg Transdermal Q72H  . DISCONTD:  Warfarin - Pharmacist Dosing Inpatient   Does not apply q1800   Continuous Infusions:   . sodium chloride 75 mL/hr at 03/30/12 0732   PRN Meds:.acetaminophen, LORazepam, promethazine, DISCONTD: bisacodyl, DISCONTD: zolpidem   Ht: 6' (182.9 cm)  Wt: 182 lb 9.6 oz (82.827 kg)  Ideal Wt: 80.9 kg % Ideal Wt: 102.2%  Usual Wt: 204 lb. Per patient, 6 weeks ago.  % Usual Wt: 89.2% *Patient with 22 lb weight loss over 6 weeks (10.78% weight loss from baseline)  Body mass index is 24.77 kg/(m^2).  Food/Nutrition Related Hx: Patient reports poor appetite. He stated his appetite has been poor for the last six weeks causing a decrease in PO intake. Patient stated he has been consuming less than 50% of his meals over the last 6 weeks. PO intake documented 100% at dinner meal on 4/9.   Labs:  CMP     Component Value Date/Time   NA 137 03/28/2012 1950   K 4.9 03/28/2012 1950   CL 100 03/28/2012 1950   CO2 33* 03/28/2012 1950   GLUCOSE 118* 03/28/2012 1950   BUN 15 03/28/2012 1950   CREATININE 0.93 03/28/2012 1950   CALCIUM 9.0 03/28/2012 1950   PROT 6.6 03/28/2012 1950   ALBUMIN 3.1* 03/28/2012 1950   AST 22 03/28/2012 1950   ALT 16 03/28/2012 1950   ALKPHOS 234* 03/28/2012  1950   BILITOT 0.3 03/28/2012 1950   GFRNONAA 73* 03/28/2012 1950   GFRAA 84* 03/28/2012 1950    Intake/Output Summary (Last 24 hours) at 03/30/12 1340 Last data filed at 03/30/12 0657  Gross per 24 hour  Intake 3435.83 ml  Output   2750 ml  Net 685.83 ml     Diet Order: General  Supplements/Tube Feeding: none at this time  IVF:    sodium chloride Last Rate: 75 mL/hr at 03/30/12 0732    Estimated Nutritional Needs:   Kcal: 7829-5621 Protein: 124-165.4 grams Fluid: 1 ml per kcal  NUTRITION DIAGNOSIS: -Inadequate oral intake (NI-2.1).  Status: Ongoing  RELATED TO: poor appetite  AS EVIDENCE BY: patient reports appetite poor over the previous six weeks, PO intake less than 50% at meals and unintentional weight loss of 22  lb over 6 weeks. (10.78% from baseline)  MONITORING/EVALUATION(Goals): PO intake, Weight trends, Labs 1. PO intake > 75% at meals and suppelments. 2. Minimize weight loss  EDUCATION NEEDS: -No education needs identified at this time  INTERVENTION: 1. Will order patient Vanilla Ensure supplement to increase caloric intake. Provides 500 kcal and 18 grams of protein daily.  2. RD to follow for nutrition plan of care.   Dietitian [phone]  DOCUMENTATION CODES Per approved criteria  -Severe malnutrition in the context of chronic illness  *Patient meets criteria for severe malnutrition in the context of chronic illness due to consuming less than or equal to 75% of estimated energy requirements for greater than or equal to 1 month and > 5% weight loss from baseline over 1 month.   Iven Finn [facility] 03/30/2012, 1:29 PM

## 2012-03-30 NOTE — Progress Notes (Signed)
CSW met with Pt at length to discuss dispo. PT eval not completed by PT and needs to be complete to assist with possible SNF or ALF. Also, Pt ad mitted as obs and needs 3 days as inpt to go to snf. CSW assisted Pt in calling his family as they were unaware of his admission to the hospital. Pt followed by hospice and CSW left VM for hospice CSW to assist with plan. Pt eager to go to [facility], but in this CSW's experience he is most likely not eligible at this point. Pt to discuss options with his family and proceed with a plan. Pt also to be seen by PT to assist with dispo. CSW will follow up in the am. Pt appreciated CSW support.  Vennie Homans, [place] 03/30/2012 12:06 PM [phone]

## 2012-03-31 MED ORDER — CIPROFLOXACIN HCL 250 MG PO TABS: 250.0000 mg | ORAL_TABLET | Freq: Two times a day (BID) | ORAL | Status: DC

## 2012-03-31 MED ORDER — TUBERCULIN PPD 5 UNIT/0.1ML ID SOLN
5.0000 [IU] | Freq: Once | INTRADERMAL | Status: AC
  Administered 2012-03-31: 5 [IU] via INTRADERMAL
  Filled 2012-03-31: qty 0.1

## 2012-03-31 MED ORDER — FENTANYL 50 MCG/HR TD PT72: 1.0000 | MEDICATED_PATCH | TRANSDERMAL | Status: AC

## 2012-03-31 MED ORDER — FENTANYL 50 MCG/HR TD PT72
50.0000 ug | MEDICATED_PATCH | TRANSDERMAL | Status: DC
  Administered 2012-03-31: 50 ug via TRANSDERMAL
  Filled 2012-03-31: qty 1

## 2012-03-31 MED ORDER — PROMETHAZINE HCL 12.5 MG PO TABS: 12.5000 mg | ORAL_TABLET | Freq: Four times a day (QID) | ORAL | Status: AC | PRN

## 2012-03-31 MED ORDER — POLYETHYLENE GLYCOL 3350 17 G PO PACK: 17.0000 g | PACK | Freq: Two times a day (BID) | ORAL | Status: AC

## 2012-03-31 MED ORDER — CIPROFLOXACIN HCL 250 MG PO TABS: 250.0000 mg | ORAL_TABLET | Freq: Two times a day (BID) | ORAL | Status: AC

## 2012-03-31 MED ORDER — SENNOSIDES-DOCUSATE SODIUM 8.6-50 MG PO TABS: 1.0000 | ORAL_TABLET | Freq: Every day | ORAL | Status: AC

## 2012-03-31 MED ORDER — LORAZEPAM 0.5 MG PO TABS: 0.5000 mg | ORAL_TABLET | ORAL | Status: AC | PRN

## 2012-03-31 MED ORDER — ENSURE COMPLETE PO LIQD: 237.0000 mL | Freq: Two times a day (BID) | ORAL | Status: AC

## 2012-03-31 NOTE — Progress Notes (Signed)
03/31/2012 Raynelle Bring BSN CCM [phone] CM notified by Hospice rep that pt will need lightweight wheelchair; [facility] notified and will deliver to patient's room.

## 2012-03-31 NOTE — Progress Notes (Signed)
03/31/2012 Treg Diemer,RN BSN CCM [phone] CM NOTIFIED BY CSW THAT PATIENT WILL GO HOME PRIOR TO GOING TO ASSISTED LIVING.  Patient's son in law flying in to be with patient and assist with ALF search. [facility] has been notified and will continue services at his home.

## 2012-03-31 NOTE — Discharge Summary (Signed)
Physician Discharge Summary  Patient ID: [name] MRN: [id number] DOB/AGE: 05/30/1922 78 y.o.  Admit date: 03/28/2012 Discharge date: 03/31/2012  Admission Diagnoses: weakness  Discharge Diagnoses:  Active Problems: Failure to thrive/falls-gait abnormality Metastatic prostate cancer History of CAD History of atrial flutter/fibrillation BPH Osteoarthritis Diastolic CHF Gastric ulcer Basal cell skin cancer Elevated INR   Discharged Condition: fair  Hospital Course:78 years old, male with history of prostate cancer, atrial fibrillation, coronary artery disease, osteoarthritis-admitted with weakness failure to thrive, elevated INR. Metastatic prostate cancer. Problem #1: metastatic prostate cancer with UTI, no further treatment, elevated PSA. palliative care consult was obtained; comfort measures. No further treatments. Patient continues to have condom catheter with good urine flow. Pain control adequate with fentanyl patch. Problem #2: UTI started on IV Cipro; switch to p.o. Cipro for total of 7 more days. No fever, white count normal. Problem #3: history of atrial fibrillation, patient was on Coumadin. INR was elevated. Because of his risk for fall, Coumadin was discontinued. Blood pressure may stable continue on amiodarone he should remain in sinus rhythm. No more Coumadin. Problem #4: deconditioning and weakness with osteoarthritis, physical therapy consulted. Recommended SNIF care. Problem #5: constipation; patient was started on milk of magnesia without much results he was later switched to MiraLax and Senokot with good results.    Consults: Palliative care, physical therapy  Significant Diagnostic Studies: labs: laboratory data: UA was positive, potassium 4.9, creatinine 0.9 hemoglobin 11.9 WBC 5.6 TSH 0.8 and radiology: CXR: metastatic disease of the bone and NWG:NFAO gas pattern, no obstruction  Treatments: IV hydration and antibiotics: Cipro  Discharge Exam: Blood  pressure 138/65, pulse 57, temperature 97.5 F (36.4 C), temperature source Oral, resp. rate 16, height 6' (1.829 m), weight 82.827 kg (182 lb 9.6 oz), SpO2 93.00%.   Disposition: 01skilled care facility with palliative care  Discharge Orders    Future Appointments: Provider: Department: Dept Phone: Center:   04/11/2012 10:00 AM [facility] [phone] None   05/11/2012 10:00 AM Laurey Morale, MD [facility] [phone] [facility]     Future Orders Please Complete By Expires   Diet - low sodium heart healthy      Diet - low sodium heart healthy      Increase activity slowly      Increase activity slowly        Medication List  As of 03/31/2012  8:17 AM   STOP taking these medications         warfarin 5 MG tablet         TAKE these medications         acetaminophen 500 MG tablet   Commonly known as: TYLENOL   Take 500 mg by mouth every 6 (six) hours as needed. For pain      amiodarone 100 MG tablet   Commonly known as: PACERONE   Take 50 mg by mouth daily.      ciprofloxacin 250 MG tablet   Commonly known as: CIPRO   Take 1 tablet (250 mg total) by mouth 2 (two) times daily.      doxazosin 2 MG tablet   Commonly known as: CARDURA   Take 2 mg by mouth at bedtime.      feeding supplement Liqd   Take 237 mLs by mouth 2 (two) times daily between meals.      fentaNYL 50 MCG/HR   Commonly known as: DURAGESIC - dosed mcg/hr   Place 1 patch (50 mcg total) onto  the skin every 3 (three) days. **HOSPICE PATIENT**      finasteride 5 MG tablet   Commonly known as: PROSCAR   Take 5 mg by mouth daily.      LORazepam 0.5 MG tablet   Commonly known as: ATIVAN   Take 1 tablet (0.5 mg total) by mouth every 4 (four) hours as needed for anxiety.      multivitamin tablet   Take 1 tablet by mouth daily.      OMEGA 3 1200 MG Caps   Take 2 capsules by mouth daily.      polyethylene glycol packet   Commonly known as: MIRALAX / GLYCOLAX     Take 17 g by mouth 2 (two) times daily.      promethazine 12.5 MG tablet   Commonly known as: PHENERGAN   Take 1 tablet (12.5 mg total) by mouth every 6 (six) hours as needed for nausea.      senna-docusate 8.6-50 MG per tablet   Commonly known as: Senokot-S   Take 1 tablet by mouth at bedtime.      Vitamin D3 1000 UNITS Caps   Take 1 capsule by mouth daily.          discharge planning time taken over 45 minutes.   SignedGeorgann Housekeeper 03/31/2012, 8:17 AM

## 2012-03-31 NOTE — Progress Notes (Signed)
CSW met with Pt at length and spoke to son in law that is out of state. Son in law coming from [place] in the am to assist with decision making re. ALF for pt and will take Pt home and supervise him while they look for ALFs. CSW began bedsearch for ALF and FL2 is on chart and needs signatures. CSW called Dr. Donette Larry and informed him of this plan.  [name], [place] 03/31/2012 11:38 AM [phone]

## 2012-03-31 NOTE — Progress Notes (Signed)
Pt comfortable discusssed option of d/c with hospice and SW SNF when bed available  Medical ready for D/c BM- yerterday Will do d/c summary

## 2012-03-31 NOTE — Progress Notes (Signed)
Room 1435 - Renato Shin.  [facility]  [facility] RN Visit  Related admission to [facility] diagnosis of metastatic prostate cancer.  Pt is DNR code.    Pt alert & oriented, sitting up  in bed, with no complaints of pain or discomfort. Pt states he has adjusted to his continuing weakness and is ok with that.  No family present.    Per pt, plan for disposition is to be discharged home to his "loft" and investigate ALF living.  FL2 has been prepared and Remonia Richter, hospital SW is assisting with this plan.  Pt's son in law is driving from [place] tomorrow to be with him until he changes residences to an ALF.  Further questioning patient for DME needs, he decided on light weight wheel chair for home.  He continued to state he would take a cab home and his son in law would be there sometime tomorrow night or Sat.  This ensued a discussion on safety and resulted in pt calling a friend, Isabella Bowens @ [phone] who verified via phone conversation with this writer he will pick up pt tomorrow at discharge, be available to go over discharge instructions and will transport patient to his home address and stay with pt until patients son in law arrives to stay with him, search for ALF and then assist in the permanent move to ALF.  This information was relayed to patients staff RN, as well as CM-RN Bonita Quin.  [facility] SW and homecare RN advised of above.  Pt getting a TB test placed today @ 2:30pm and reading will need to take place on Saturday after 2:30pm.  [facility] home care RN advised as well as [facility] on call.   Per CM-RN Bonita Quin, [facility] will deliver wheelchair to hospital for patients transport home.   Please call [facility] @ [phone] with any needs.  Thank you.  Joneen Boers, RN  [facility]  Hospice Liaison

## 2012-03-31 NOTE — Progress Notes (Signed)
CSW continues to follow to assist with dispo. PT saw pt and recommend no therapies. Pt has no skilled needs and would not qualify for SNF. CSW working on ALF for Pt, Pt will need TB test to assist with d/c to ALF. Pt may need to be d/c'd home and then to ALF as he is quite particular about his needs. CSW has been calling ALFs on his behalf. CSW feels Pt will want to go visit ALFs prior to his d/c to one. CSW explained these issues to Pt and his family that live in [place] via phone.  FL2 placed in chart for signature... meds to be added on d/c.  Vennie Homans, [place] 03/31/2012 11:08 AM [phone]

## 2012-03-31 NOTE — Clinical Documentation Improvement (Signed)
MALNUTRITION DOCUMENTATION CLARIFICATION  THIS DOCUMENT IS NOT A PERMANENT PART OF THE MEDICAL RECORD  TO RESPOND TO THE THIS QUERY, FOLLOW THE INSTRUCTIONS BELOW:  1. If needed, update documentation for the patient's encounter via the notes activity.  2. Access this query again and click edit on the In [employer].  3. After updating, or not, click F2 to complete all highlighted (required) fields concerning your review. Select "additional documentation in the medical record" OR "no additional documentation provided".  4. Click Sign note button.  5. The deficiency will fall out of your In Basket *Please let us know if you are not able to complete this workflow by phone or e-mail (listed below).  Please update your documentation within the medical record to reflect your response to this query.                                                                                        03/31/12   Dear Dr.M [name],  In a better effort to capture your patient's severity of illness, reflect appropriate length of stay and utilization of resources, a review of the patient medical record has revealed the following indicators.    Based on your clinical judgment, please clarify and document in a progress note and/or discharge summary the clinical condition associated with the following supporting information:  In responding to this query please exercise your independent judgment.  The fact that a query is asked, does not imply that any particular answer is desired or expected.  03/30/12 Nurtition Eval w/ nutrition doucmentation for "-Severe malnutrition in the context of chronic illness".Marland KitchenMarland KitchenFor accurate Dx specificity & severity please help verify/validate noted Nutrition Dx being eval/mon & tx'd. Thank you  Possible Clinical Conditions?  Mild Malnutrition  Moderate Malnutrition Severe Malnutrition   Protein Calorie Malnutrition Severe Protein Calorie Malnutrition Emaciation    Cachexia   Other Condition Cannot clinically determine  Supporting Information: Risk Factors: See Nutrition Eval 03/30/12  Signs & Symptoms: See Nutrition Eval 03/30/12  -Diagnostics: See Nutrition Eval 03/30/12  Treatments: See Nutrition Eval 03/30/12  -Nutrition Consult: See Nutrition Eval 03/30/12   You may use possible, probable, or suspect with inpatient documentation. possible, probable, suspected diagnoses MUST be documented at the time of discharge  Reviewed:  no additional documentation provided: 04/01/12 no add dco per dc summ.orm  Thank You,  Toribio Harbour, RN, BSN, CCDS Certified Clinical Documentation Specialist Pager: [phone]  Health Information Management [place]

## 2012-04-01 NOTE — Progress Notes (Signed)
Room 1435 - [facility]  [facility] RN Visit  Related admission to [facility] diagnosis of metastatic prostate cancer. Pt is DNR code.    Pt alert & oriented, sitting up off side of bed, with no complaints of pain or discomfort.  Darryll Capers [place]  present.  Pt awaiting discharge - getting dressed in regular clothes with assistance of NT.  Pt awaiting delivery of light weight wheel chair for discharge.  Staff RN Abby to go over discharge instructions with patient and friend.  Friend Ron will be staying 24/7 with patient at home until patient's son in law arrives-driving from [place].   Advised patietn the nutritional supplement can be purchased at [facility] or any grocery store.  Pt states he does not like the Ensure.  Offered names of Boost, Naked Protein and [place] protein supplements.  Pt and friend Ron instructed to call [facility] CCC at arrival to home  TB test placed 4/11 @ 2:30pm.  [facility] will read on Saturday 4/13 after 2:30pm and before Sunday 4/14 @2 :30pm.  [facility] home care RN and on-call staff aware and [facility] DON for [place] team aware of TB reading need.   Please call [facility] @ [phone] with any needs.  Thank you.  Joneen Boers, RN  [facility]  Hospice Liaison

## 2012-04-01 NOTE — Progress Notes (Signed)
SW- note  State- he is not candidate for SNF and at this point not for [facility] Son-in-law in town, and assistant at home 24 hr arranged; home with [facility] and palliative care; will be looking for ALF. Overnight no problem constipation resolved VSS Continue with ABX/ pain control Eqiup for home rx in shadow file.

## 2012-04-04 ENCOUNTER — Encounter (HOSPITAL_COMMUNITY): Payer: Self-pay | Admitting: *Deleted

## 2012-04-04 ENCOUNTER — Emergency Department (HOSPITAL_COMMUNITY)
Admission: EM | Admit: 2012-04-04 | Discharge: 2012-04-07 | Disposition: A | Discharge status: Skilled Nursing Facility | Attending: Emergency Medicine | Admitting: Emergency Medicine

## 2012-04-04 DIAGNOSIS — S61509A Unspecified open wound of unspecified wrist, initial encounter: Secondary | ICD-10-CM | POA: Insufficient documentation

## 2012-04-04 DIAGNOSIS — K219 Gastro-esophageal reflux disease without esophagitis: Secondary | ICD-10-CM | POA: Insufficient documentation

## 2012-04-04 DIAGNOSIS — I4891 Unspecified atrial fibrillation: Secondary | ICD-10-CM | POA: Insufficient documentation

## 2012-04-04 DIAGNOSIS — R5381 Other malaise: Secondary | ICD-10-CM | POA: Insufficient documentation

## 2012-04-04 DIAGNOSIS — F3289 Other specified depressive episodes: Secondary | ICD-10-CM | POA: Insufficient documentation

## 2012-04-04 DIAGNOSIS — X789XXA Intentional self-harm by unspecified sharp object, initial encounter: Secondary | ICD-10-CM | POA: Insufficient documentation

## 2012-04-04 DIAGNOSIS — G319 Degenerative disease of nervous system, unspecified: Secondary | ICD-10-CM | POA: Insufficient documentation

## 2012-04-04 DIAGNOSIS — I2510 Atherosclerotic heart disease of native coronary artery without angina pectoris: Secondary | ICD-10-CM | POA: Insufficient documentation

## 2012-04-04 DIAGNOSIS — R45851 Suicidal ideations: Secondary | ICD-10-CM | POA: Insufficient documentation

## 2012-04-04 DIAGNOSIS — Z79899 Other long term (current) drug therapy: Secondary | ICD-10-CM | POA: Insufficient documentation

## 2012-04-04 DIAGNOSIS — Z8546 Personal history of malignant neoplasm of prostate: Secondary | ICD-10-CM | POA: Insufficient documentation

## 2012-04-04 DIAGNOSIS — M1990 Unspecified osteoarthritis, unspecified site: Secondary | ICD-10-CM | POA: Insufficient documentation

## 2012-04-04 DIAGNOSIS — R531 Weakness: Secondary | ICD-10-CM

## 2012-04-04 DIAGNOSIS — F329 Major depressive disorder, single episode, unspecified: Secondary | ICD-10-CM | POA: Insufficient documentation

## 2012-04-04 LAB — CBC
HCT: 35.4 % — ABNORMAL LOW (ref 39.0–52.0)
Hemoglobin: 11.9 g/dL — ABNORMAL LOW (ref 13.0–17.0)
MCH: 29.6 pg (ref 26.0–34.0)
MCHC: 33.6 g/dL (ref 30.0–36.0)

## 2012-04-04 LAB — ETHANOL: Alcohol, Ethyl (B): 138 mg/dL — ABNORMAL HIGH (ref 0–11)

## 2012-04-04 LAB — COMPREHENSIVE METABOLIC PANEL
BUN: 15 mg/dL (ref 6–23)
Calcium: 8.2 mg/dL — ABNORMAL LOW (ref 8.4–10.5)
GFR calc Af Amer: 75 mL/min — ABNORMAL LOW (ref >90)
Glucose, Bld: 109 mg/dL — ABNORMAL HIGH (ref 70–99)
Total Protein: 6 g/dL (ref 6.0–8.3)

## 2012-04-04 MED ORDER — ONDANSETRON HCL 4 MG PO TABS: 4.0000 mg | ORAL_TABLET | Freq: Three times a day (TID) | ORAL | Status: DC | PRN

## 2012-04-04 MED ORDER — IBUPROFEN 200 MG PO TABS
600.0000 mg | ORAL_TABLET | Freq: Three times a day (TID) | ORAL | Status: DC | PRN
  Administered 2012-04-04: 600 mg via ORAL
  Filled 2012-04-04: qty 3

## 2012-04-04 MED ORDER — LORAZEPAM 1 MG PO TABS
1.0000 mg | ORAL_TABLET | Freq: Once | ORAL | Status: DC
  Filled 2012-04-04: qty 1

## 2012-04-04 MED ORDER — LORAZEPAM 1 MG PO TABS
1.0000 mg | ORAL_TABLET | Freq: Once | ORAL | Status: AC
  Administered 2012-04-04: 1 mg via ORAL

## 2012-04-04 MED ORDER — ALUM & MAG HYDROXIDE-SIMETH 200-200-20 MG/5ML PO SUSP: 30.0000 mL | ORAL | Status: DC | PRN

## 2012-04-04 MED ORDER — ACETAMINOPHEN 325 MG PO TABS: 650.0000 mg | ORAL_TABLET | ORAL | Status: DC | PRN

## 2012-04-04 MED ORDER — BACITRACIN ZINC 500 UNIT/GM EX OINT
TOPICAL_OINTMENT | CUTANEOUS | Status: AC
  Administered 2012-04-04: 2
  Filled 2012-04-04: qty 1.8

## 2012-04-04 NOTE — ED Notes (Signed)
Security remains at bedside. Pt continues to try to get out of bed. PA chris lawyer notified and orders given to medicate pt.

## 2012-04-04 NOTE — ED Notes (Signed)
Pt refuses blood draw at this time.
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
## 2012-04-04 NOTE — ED Notes (Signed)
Sitter at bedside.

## 2012-04-04 NOTE — ED Notes (Signed)
Per nurse tech, 2nd attempt to collect urine specimen. Pt. Stated" I don't have no urine".

## 2012-04-04 NOTE — ED Notes (Signed)
Spoke with pt. Pt appears calmer, more cooperative at this time. Allowed RN to collect lab work. Sitter at bedside. Awaiting act consult.

## 2012-04-04 NOTE — ED Notes (Signed)
EAV:[id number]> Expected date:<BR> Expected time:12:06 PM<BR> Means of arrival:<BR> Comments:<BR> M51 - 89yoM suicide att, lacs to wrists

## 2012-04-04 NOTE — ED Notes (Signed)
Pt requested to walk the walls with the presence of the sitter.  I told pt and sitter that this would be fine.

## 2012-04-04 NOTE — ED Notes (Signed)
Pt up to doorway in room, stating "I just want to fall on my face and die, I just want to die, why won't yall let me die." Security notified and pt directed back into room. Pt continues to curse and say "dammit just let me die."

## 2012-04-04 NOTE — ED Notes (Signed)
Urine that was sent to lab was sent without a patient label.  Will attempt to recollect urine and send again.

## 2012-04-04 NOTE — ED Notes (Signed)
Pt's son sts he is leaving because he seemed to agitate him more when he was in the room. Informed of plan of care and process for psych consult. Given dept phone number to call for update when needed. Son's contact if needed [phone]. Thayer Ohm, EDPA at bedside assessing pt.
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
## 2012-04-04 NOTE — ED Notes (Signed)
Pt in from home, c/o SI, superficial lacs to bil wrists. Fentanyl patches on bil arms, removed by ems. Per ems, pt's family trying to talk to pt about moving into assisted living and pt does not want to. They believe this is cause for SI. Per family, pt does not have Hx of SI or attempts. Ems reports etoh on board, with 3 empty bottles of liquor beside bed.

## 2012-04-04 NOTE — ED Notes (Signed)
Pt. refused to give urine specimen. Nurse aware

## 2012-04-04 NOTE — ED Notes (Signed)
Sitter at bedside. Awaiting act consult.

## 2012-04-05 ENCOUNTER — Emergency Department (HOSPITAL_COMMUNITY)

## 2012-04-05 MED ORDER — LORAZEPAM 1 MG PO TABS
1.0000 mg | ORAL_TABLET | Freq: Every evening | ORAL | Status: DC | PRN
  Administered 2012-04-05 – 2012-04-06 (×3): 1 mg via ORAL
  Filled 2012-04-05 (×3): qty 1

## 2012-04-05 NOTE — ED Notes (Signed)
Pt has been denied at [facility] due to status of being a "hospice patient".

## 2012-04-05 NOTE — ED Notes (Addendum)
[facility] CONTACT [phone] [name]

## 2012-04-05 NOTE — Progress Notes (Signed)
ED RM [address] Montez Hageman.                    [facility] SW Note:  Pt denied pain, although he felt generally uncomfortable. No family present at the time of SW's visit. Actively listened as pt shared some of his thoughts on his actions and disposition. He hopes to be discharged to the [facility]. His son in law [name] also hopes for pt to be admitted there (second choice- inpt behavioral health in [place]? as discussed with hospital SW) but still intends to follow up with [facility] as a back up discharge plan from whichever center he may be sent. If pt is sent out of the area, he will need to revoke hospice services, of which pt and son in law are aware. Left VM for CSW Mindi Junker requesting she contact this SW.    Exie Parody Shon Hough, LCSWA

## 2012-04-05 NOTE — ED Notes (Signed)
Hospice nurse at bedside.

## 2012-04-05 NOTE — Progress Notes (Signed)
Room [facility] ED 31 - [name]   [facility]  [facility] RN Visit  Pt alert & oriented, lying on ED stretcher, with no complaints of pain or discomfort, states he feels very sad and embarassed.    Son in Art gallery manager present.  Pt states he did something stupid - thought maybe his family would be better if he was "out of the picture."   Hospital SW [place] working on placement for [employer].  Patient's [facility] med list on chart in ED.  Will continue to follow up throughout the day and update [facility] staff re: disposition and revocation of [facility] services if being placed outside of [place].    Please call [facility] @ [phone] with any needs.  Thank you.  Joneen Boers, RN  [facility]  Hospice Liaison

## 2012-04-05 NOTE — Discharge Planning (Signed)
Patient was declined by [facility] as they do not accept DNR patient's or able to meet his possible medical needs. CSW spoke with Joey at [facility] who advised he will check with staff to identify services (inpt or outpt) that can be offered.  Manson Passey Jakeisha Stricker ANN S , MSW, LCSWA 04/05/2012 2:20 PM [phone]

## 2012-04-05 NOTE — Discharge Planning (Signed)
CSW met with pt and son in law at bedside.  Introduced self and explained process of finding bed.  Pt expressed interest in going to [facility]. CSW left message for patient trf coordinator to see if there is a geriatric psych unit at [facility]. CSW faxed pt's information to [employer]. Confirmed with Christiane Ha that it was recvd and will be ran shortly.  CSW also spoke with Kathie Rhodes at [facility] and confirmed they recvd information. Staff advised CSW they are not sure if they accept hospice patient's however will confirm and let us know along with disposition. Staff will continue to follow up on appropriate disposition for patient.  Manson Passey [name] ANN S , MSW, LCSWA 04/05/2012 1:31 PM [phone]

## 2012-04-05 NOTE — ED Notes (Signed)
Previous patients belonging found in locker 31. No documentation found on where belongings may have been. Patient is however in paper scrubs and red socks. Patient belongings found at bedside in bag. Belongings removed from room locked in locker 31.

## 2012-04-05 NOTE — ED Notes (Signed)
CSW contacted Clydie Braun, AOD at [facility], to discuss possible placement for the pt. Per Clydie Braun, it is unlikely that the pt would be accepted to the inpatient psychiatric unit, that the pt may need to go the [facility]). Per Clydie Braun, the inpatient psych unit may not be able to accommodate a geriatric pt with hospice services. She added that the inpatient psych unit is also on diversion at this time with no available beds for the next 2 days.  Clydie Braun states that Joey at the [place] is working towards coordinating the pt's care and has left for the day. Oncoming CSW will need to follow up with with Joey at the [facility] on 04/06/12 to continue working towards [place] placement.

## 2012-04-05 NOTE — ED Notes (Signed)
IVC papers received from [employer], four copies placed in chart.

## 2012-04-06 MED ORDER — FENTANYL 50 MCG/HR TD PT72
50.0000 ug | MEDICATED_PATCH | TRANSDERMAL | Status: DC
  Administered 2012-04-06: 50 ug via TRANSDERMAL

## 2012-04-06 MED ORDER — ENSURE COMPLETE PO LIQD
237.0000 mL | Freq: Two times a day (BID) | ORAL | Status: DC
  Administered 2012-04-06 – 2012-04-07 (×2): 237 mL via ORAL
  Filled 2012-04-06 (×5): qty 237

## 2012-04-06 MED ORDER — CIPROFLOXACIN HCL 500 MG PO TABS: 250.0000 mg | ORAL_TABLET | Freq: Two times a day (BID) | ORAL | Status: DC

## 2012-04-06 MED ORDER — AMIODARONE HCL 100 MG PO TABS
50.0000 mg | ORAL_TABLET | Freq: Every day | ORAL | Status: DC
  Administered 2012-04-06 – 2012-04-07 (×2): 50 mg via ORAL
  Filled 2012-04-06 (×3): qty 1

## 2012-04-06 MED ORDER — ZOLPIDEM TARTRATE 5 MG PO TABS
5.0000 mg | ORAL_TABLET | Freq: Once | ORAL | Status: AC
  Administered 2012-04-06: 5 mg via ORAL
  Filled 2012-04-06: qty 1

## 2012-04-06 NOTE — Progress Notes (Signed)
ED Sw and Hospice coordinator updated on changes for pt comfort level New bed has arrived to [facility]

## 2012-04-06 NOTE — Progress Notes (Signed)
Room ED # 31 - [name]  [facility]  [facility] RN Visit  Related admission to [facility] diagnosis of metastatic prostate cancer.  Pt is DNR code with OOF DNR on chart.    Pt alert & oriented, lying on ED stretcher, with complaints of RUE stiffness and discomfort due to being on a stretcher for the last 48 hours. Per staff RN, they assisted pt to get up into chair with lift due to his inability to ambulate. Conversation with SW [place], ED SW Selena Batten re: how to increase comfort, possible admission, unfortunate situation of [facility] and campus is on lock down x 24 hours.  No family present.  Son in law [name] was present earlier this am.  Spoke to him on the phone - he is visiting with ALF [facility] to investigate if and when they would accept patient and will advise.  Will continue to follow up.    Please call [facility] @ [phone] with any needs.  Thank you.  Joneen Boers, RN  [facility]  Hospice Liaison

## 2012-04-06 NOTE — Discharge Planning (Signed)
CSW faxed transfer information to the [facility]. Will continue to follow up on disposition.  Manson Passey Camya Haydon ANN S , MSW, LCSWA 04/06/2012 8:30 AM [phone]

## 2012-04-06 NOTE — Progress Notes (Signed)
ED CM spoke with EDP who suggest speaking with ED charge RN to assist with getting pt a more comfortable bed Not meeting admission criteria
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
## 2012-04-06 NOTE — ED Notes (Signed)
MD informed pt requesting pain meds & Med list has been reconciled.

## 2012-04-06 NOTE — Progress Notes (Signed)
ED Cm receiived call from hospice coordinator stating son in law, jim spoke with her about [facility] being able to receive pt if initial forms completed.  Dr [name] returns on 04/12/12 CM updated EDP, ED SW. Forms received on fax and given to SW

## 2012-04-06 NOTE — ED Notes (Signed)
Sitter remains @ BS. 

## 2012-04-06 NOTE — Progress Notes (Signed)
ED CM contacted by ED SW to inquire about reviewing of pt chart to possible check for reasons for possible admission Pt not able to return home ED SW working with  [place] but on 04/06/12 issues with work flow for [facility] pts Hospice coordinator does not have opening at [facility] to assist.  CM reviewed ED labs noted form 04/04/12 indicating abnomal etoh and hgb.  CM suggestion for EDP to possibly do further labs for evaluation of needed admission.

## 2012-04-06 NOTE — Progress Notes (Signed)
ED Rm 31         [name].               [facility] SW Note:  Pt is hopeful that he will be admitted to [facility]. He remains open to hospice services there. Inquired about any needs or concerns regarding the move. He reported that he was confident about receiving good support and care while there, particularly since his son in law has given his stamp of approval on that particular AL facility. Pt denied any immediate needs. Assured him of continued support. Exie Parody Shon Hough, LCSWA

## 2012-04-06 NOTE — ED Notes (Signed)
CSW contacted pt's son-in-law [name] to inquire about pt's status at [facility] for admission. Per [name], pt has a bed available and will be able to discharge there tomorrow. Pt's belongings are being transferred to the facility at 10am and the pt's room will be ready by 2pm on 04/07/12. Pt will need to be transported by [facility]. Oncoming CSW will continue to assist to ensure patient is able to discharge safely to [facility] on 04/07/12.

## 2012-04-06 NOTE — ED Provider Notes (Addendum)
78 yo M, c/o SI by cutting wrists.  Is a Hospice pt.  Med rec completed.  Social Worker, Sports coach, ACT team and pt's Hospice RN are all involved with the pt's dispo.  Pt has been refused at several facilities due to hospice status, does not qualify for inpt hospice, or inpt medical at this time.  May be able to go to the [place] or SNF [facility]).  Dispo continues pending.     Laray Anger, DO 04/06/12 1507  Laray Anger, DO 04/06/12 1513

## 2012-04-06 NOTE — ED Notes (Signed)
CT notified pt has completed contrast. 

## 2012-04-07 DIAGNOSIS — F39 Unspecified mood [affective] disorder: Secondary | ICD-10-CM

## 2012-04-07 MED ORDER — FENTANYL 50 MCG/HR TD PT72: 1.0000 | MEDICATED_PATCH | TRANSDERMAL | Status: DC

## 2012-04-07 NOTE — ED Provider Notes (Addendum)
Medical screening examination/treatment/procedure(s) were performed by non-physician practitioner and as supervising physician I was immediately available for consultation/collaboration.  Juliet Rude. Rubin Payor, MD 04/07/12 [phone]  Patient will be transferred to an assisted living at  2 pm today.  Juliet Rude. Rubin Payor, MD 04/07/12 [phone]

## 2012-04-07 NOTE — ED Notes (Signed)
[facility] here to transport pt to [facility]
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
## 2012-04-07 NOTE — Progress Notes (Signed)
Spoke with Tresa Endo at [facility]- patient has a level 1 PASARR number for placement in an Assisted Living. Patient will not require a new screening and current number remains in effect.  Patient has been cleared by Psychiatry and denies any SI/HI/AHVH. Patient is followed by [facility] services of [place].  Bradly Bienenstock, SW Intern 04/07/2012 11:28 AM

## 2012-04-07 NOTE — ED Notes (Signed)
ACT team at bedside.  

## 2012-04-07 NOTE — Progress Notes (Signed)
Room [facility] ED # 31 - [name]    [facility]  [facility] RN Visit  Related admission to [facility] diagnosis of metastatic prostate cancer.   Pt is DNR code with OOF DNR on chart.  Pt alert & oriented, sitting up in bed, eating breakfast and visiting with [facility] RN Dimas Millin.  Pt has no complaints of pain or discomfort.    No family present.    Per patient, Joselyn Arrow will have a bed ready at 2pm today.  Soninlaw [name] is getting furniture and personal belongings moved in this am.  Pt appears to be pleased all is working out.  Dtr to visit within the next couple weeks.    Please call [facility] @ [phone] with any needs.  Thank you.  Joneen Boers, RN  [facility]  Hospice Liaison

## 2012-04-08 ENCOUNTER — Emergency Department (HOSPITAL_COMMUNITY)

## 2012-04-08 ENCOUNTER — Observation Stay (HOSPITAL_COMMUNITY)
Admission: EM | Admit: 2012-04-08 | Discharge: 2012-04-11 | DRG: 948 | Disposition: A | Discharge status: Hospice | Attending: Internal Medicine | Admitting: Internal Medicine

## 2012-04-08 DIAGNOSIS — I2510 Atherosclerotic heart disease of native coronary artery without angina pectoris: Secondary | ICD-10-CM | POA: Diagnosis present

## 2012-04-08 DIAGNOSIS — Z66 Do not resuscitate: Secondary | ICD-10-CM | POA: Diagnosis present

## 2012-04-08 DIAGNOSIS — Y92009 Unspecified place in unspecified non-institutional (private) residence as the place of occurrence of the external cause: Secondary | ICD-10-CM

## 2012-04-08 DIAGNOSIS — J309 Allergic rhinitis, unspecified: Secondary | ICD-10-CM | POA: Diagnosis present

## 2012-04-08 DIAGNOSIS — R630 Anorexia: Secondary | ICD-10-CM | POA: Diagnosis present

## 2012-04-08 DIAGNOSIS — K219 Gastro-esophageal reflux disease without esophagitis: Secondary | ICD-10-CM | POA: Diagnosis present

## 2012-04-08 DIAGNOSIS — W19XXXA Unspecified fall, initial encounter: Secondary | ICD-10-CM | POA: Diagnosis present

## 2012-04-08 DIAGNOSIS — R269 Unspecified abnormalities of gait and mobility: Secondary | ICD-10-CM | POA: Diagnosis present

## 2012-04-08 DIAGNOSIS — S40019A Contusion of unspecified shoulder, initial encounter: Secondary | ICD-10-CM | POA: Diagnosis present

## 2012-04-08 DIAGNOSIS — R634 Abnormal weight loss: Secondary | ICD-10-CM | POA: Diagnosis present

## 2012-04-08 DIAGNOSIS — Z515 Encounter for palliative care: Secondary | ICD-10-CM

## 2012-04-08 DIAGNOSIS — R52 Pain, unspecified: Secondary | ICD-10-CM

## 2012-04-08 DIAGNOSIS — W1809XA Striking against other object with subsequent fall, initial encounter: Secondary | ICD-10-CM | POA: Diagnosis present

## 2012-04-08 DIAGNOSIS — M8448XA Pathological fracture, other site, initial encounter for fracture: Secondary | ICD-10-CM | POA: Diagnosis present

## 2012-04-08 DIAGNOSIS — Y9289 Other specified places as the place of occurrence of the external cause: Secondary | ICD-10-CM

## 2012-04-08 DIAGNOSIS — M1710 Unilateral primary osteoarthritis, unspecified knee: Secondary | ICD-10-CM | POA: Diagnosis present

## 2012-04-08 DIAGNOSIS — C7951 Secondary malignant neoplasm of bone: Secondary | ICD-10-CM | POA: Diagnosis present

## 2012-04-08 DIAGNOSIS — Y998 Other external cause status: Secondary | ICD-10-CM

## 2012-04-08 DIAGNOSIS — Z8711 Personal history of peptic ulcer disease: Secondary | ICD-10-CM

## 2012-04-08 DIAGNOSIS — R609 Edema, unspecified: Secondary | ICD-10-CM | POA: Diagnosis present

## 2012-04-08 DIAGNOSIS — G893 Neoplasm related pain (acute) (chronic): Principal | ICD-10-CM | POA: Diagnosis present

## 2012-04-08 DIAGNOSIS — IMO0001 Reserved for inherently not codable concepts without codable children: Secondary | ICD-10-CM

## 2012-04-08 DIAGNOSIS — G8929 Other chronic pain: Secondary | ICD-10-CM | POA: Diagnosis present

## 2012-04-08 DIAGNOSIS — I4891 Unspecified atrial fibrillation: Secondary | ICD-10-CM | POA: Diagnosis present

## 2012-04-08 DIAGNOSIS — C61 Malignant neoplasm of prostate: Secondary | ICD-10-CM | POA: Diagnosis present

## 2012-04-08 DIAGNOSIS — IMO0002 Reserved for concepts with insufficient information to code with codable children: Secondary | ICD-10-CM | POA: Diagnosis present

## 2012-04-08 DIAGNOSIS — S32040A Wedge compression fracture of fourth lumbar vertebra, initial encounter for closed fracture: Secondary | ICD-10-CM

## 2012-04-08 DIAGNOSIS — Z96649 Presence of unspecified artificial hip joint: Secondary | ICD-10-CM

## 2012-04-08 DIAGNOSIS — R531 Weakness: Secondary | ICD-10-CM | POA: Diagnosis present

## 2012-04-08 DIAGNOSIS — M2180 Other specified acquired deformities of unspecified limb: Secondary | ICD-10-CM | POA: Diagnosis present

## 2012-04-08 HISTORY — DX: Malignant neoplasm of prostate: C61

## 2012-04-08 LAB — DIFFERENTIAL
Basophils Absolute: 0 10*3/uL (ref 0.0–0.1)
Basophils Relative: 0 % (ref 0–1)
Eosinophils Relative: 1 % (ref 0–5)
Lymphocytes Relative: 11 % — ABNORMAL LOW (ref 12–46)
Monocytes Absolute: 1.5 10*3/uL — ABNORMAL HIGH (ref 0.1–1.0)

## 2012-04-08 LAB — BASIC METABOLIC PANEL
CO2: 27 mEq/L (ref 19–32)
Calcium: 8.5 mg/dL (ref 8.4–10.5)
Creatinine, Ser: 0.94 mg/dL (ref 0.50–1.35)
GFR calc Af Amer: 84 mL/min — ABNORMAL LOW (ref >90)
GFR calc non Af Amer: 72 mL/min — ABNORMAL LOW (ref >90)
Sodium: 134 mEq/L — ABNORMAL LOW (ref 135–145)

## 2012-04-08 LAB — URINALYSIS, ROUTINE W REFLEX MICROSCOPIC
Hgb urine dipstick: NEGATIVE
Ketones, ur: 15 mg/dL — AB
Protein, ur: 30 mg/dL — AB
Urobilinogen, UA: 1 mg/dL (ref 0.0–1.0)

## 2012-04-08 LAB — CBC
HCT: 34.3 % — ABNORMAL LOW (ref 39.0–52.0)
MCHC: 33.8 g/dL (ref 30.0–36.0)
MCV: 88.2 fL (ref 78.0–100.0)
Platelets: 189 10*3/uL (ref 150–400)
RDW: 14.2 % (ref 11.5–15.5)
WBC: 10.5 10*3/uL (ref 4.0–10.5)

## 2012-04-08 LAB — URINE MICROSCOPIC-ADD ON

## 2012-04-08 MED ORDER — HYDROMORPHONE HCL PF 1 MG/ML IJ SOLN
1.0000 mg | Freq: Once | INTRAMUSCULAR | Status: AC
  Administered 2012-04-08: 1 mg via INTRAVENOUS
  Filled 2012-04-08: qty 1

## 2012-04-08 MED ORDER — ONDANSETRON HCL 4 MG/2ML IJ SOLN
4.0000 mg | Freq: Once | INTRAMUSCULAR | Status: AC
  Administered 2012-04-08: 4 mg via INTRAVENOUS
  Filled 2012-04-08: qty 2
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
## 2012-04-08 NOTE — ED Notes (Signed)
Pt c/o right hip, shoulder, and flank pain that began after a fall earlier tonight. Pt states he was attempting to go to restroom when he fell. Pt states pain has decreased since his arrival to ED. Pt has full ROM in right leg and right arm. Pt is A&O x4.

## 2012-04-08 NOTE — ED Notes (Signed)
Bed:WA15<BR> Expected date:<BR> Expected time:<BR> Means of arrival:<BR> Comments:<BR>

## 2012-04-09 ENCOUNTER — Encounter (HOSPITAL_COMMUNITY): Payer: Self-pay | Admitting: Internal Medicine

## 2012-04-09 DIAGNOSIS — C61 Malignant neoplasm of prostate: Secondary | ICD-10-CM | POA: Diagnosis present

## 2012-04-09 MED ORDER — ACETAMINOPHEN 325 MG PO TABS: 650.0000 mg | ORAL_TABLET | Freq: Four times a day (QID) | ORAL | Status: DC | PRN

## 2012-04-09 MED ORDER — ONDANSETRON HCL 4 MG/2ML IJ SOLN: 4.0000 mg | Freq: Four times a day (QID) | INTRAMUSCULAR | Status: DC | PRN

## 2012-04-09 MED ORDER — SODIUM CHLORIDE 0.9 % IV SOLN: 250.0000 mL | INTRAVENOUS | Status: DC | PRN

## 2012-04-09 MED ORDER — OXYCODONE HCL 5 MG PO TABS
5.0000 mg | ORAL_TABLET | ORAL | Status: DC | PRN
  Administered 2012-04-09 – 2012-04-11 (×8): 5 mg via ORAL
  Filled 2012-04-09 (×8): qty 1

## 2012-04-09 MED ORDER — HEPARIN SODIUM (PORCINE) 5000 UNIT/ML IJ SOLN
5000.0000 [IU] | Freq: Three times a day (TID) | INTRAMUSCULAR | Status: DC
  Administered 2012-04-09 – 2012-04-11 (×7): 5000 [IU] via SUBCUTANEOUS
  Filled 2012-04-09 (×10): qty 1

## 2012-04-09 MED ORDER — AMIODARONE HCL 100 MG PO TABS
50.0000 mg | ORAL_TABLET | Freq: Every day | ORAL | Status: DC
  Administered 2012-04-09 – 2012-04-11 (×3): 50 mg via ORAL
  Filled 2012-04-09 (×3): qty 1

## 2012-04-09 MED ORDER — SENNOSIDES-DOCUSATE SODIUM 8.6-50 MG PO TABS
1.0000 | ORAL_TABLET | Freq: Every day | ORAL | Status: DC
  Administered 2012-04-09 – 2012-04-10 (×2): 1 via ORAL
  Filled 2012-04-09 (×5): qty 1

## 2012-04-09 MED ORDER — SODIUM CHLORIDE 0.9 % IJ SOLN: 3.0000 mL | INTRAMUSCULAR | Status: DC | PRN

## 2012-04-09 MED ORDER — ACETAMINOPHEN 650 MG RE SUPP: 650.0000 mg | Freq: Four times a day (QID) | RECTAL | Status: DC | PRN

## 2012-04-09 MED ORDER — HYDROMORPHONE HCL PF 1 MG/ML IJ SOLN
1.0000 mg | INTRAMUSCULAR | Status: DC | PRN
  Administered 2012-04-09 – 2012-04-10 (×7): 1 mg via INTRAVENOUS
  Filled 2012-04-09 (×7): qty 1

## 2012-04-09 MED ORDER — ONDANSETRON HCL 4 MG PO TABS: 4.0000 mg | ORAL_TABLET | Freq: Four times a day (QID) | ORAL | Status: DC | PRN

## 2012-04-09 MED ORDER — LORAZEPAM 0.5 MG PO TABS: 0.5000 mg | ORAL_TABLET | ORAL | Status: DC | PRN

## 2012-04-09 MED ORDER — DOXAZOSIN MESYLATE 2 MG PO TABS
2.0000 mg | ORAL_TABLET | Freq: Every day | ORAL | Status: DC
  Administered 2012-04-09 – 2012-04-10 (×2): 2 mg via ORAL
  Filled 2012-04-09 (×3): qty 1

## 2012-04-09 MED ORDER — SODIUM CHLORIDE 0.9 % IJ SOLN
3.0000 mL | Freq: Two times a day (BID) | INTRAMUSCULAR | Status: DC
  Administered 2012-04-09 – 2012-04-10 (×3): 3 mL via INTRAVENOUS

## 2012-04-09 MED ORDER — SENNA 8.6 MG PO TABS
1.0000 | ORAL_TABLET | Freq: Two times a day (BID) | ORAL | Status: DC
  Administered 2012-04-09 – 2012-04-11 (×5): 8.6 mg via ORAL
  Filled 2012-04-09 (×2): qty 1

## 2012-04-09 MED ORDER — FENTANYL 50 MCG/HR TD PT72
50.0000 ug | MEDICATED_PATCH | TRANSDERMAL | Status: DC
  Administered 2012-04-09: 50 ug via TRANSDERMAL
  Filled 2012-04-09: qty 1

## 2012-04-09 MED ORDER — DOCUSATE SODIUM 100 MG PO CAPS
100.0000 mg | ORAL_CAPSULE | Freq: Two times a day (BID) | ORAL | Status: DC
  Administered 2012-04-09 – 2012-04-11 (×5): 100 mg via ORAL
  Filled 2012-04-09 (×6): qty 1

## 2012-04-09 MED ORDER — ENSURE COMPLETE PO LIQD
237.0000 mL | Freq: Two times a day (BID) | ORAL | Status: DC
  Administered 2012-04-09 – 2012-04-10 (×2): 237 mL via ORAL

## 2012-04-09 MED ORDER — POLYETHYLENE GLYCOL 3350 17 G PO PACK
17.0000 g | PACK | Freq: Every day | ORAL | Status: DC | PRN
  Administered 2012-04-09 – 2012-04-10 (×2): 17 g via ORAL
  Filled 2012-04-09 (×3): qty 1

## 2012-04-09 MED ORDER — FINASTERIDE 5 MG PO TABS
5.0000 mg | ORAL_TABLET | Freq: Every day | ORAL | Status: DC
  Administered 2012-04-09 – 2012-04-11 (×3): 5 mg via ORAL
  Filled 2012-04-09 (×3): qty 1

## 2012-04-09 NOTE — Progress Notes (Signed)
Subjective: Admission history and physical reviewed, patient admitted after having a fall. Currently he states his pain is well controlled. He is without any complaints. There are no problems per nursing  Objective: Vital signs in last 24 hours: Temp:  [97.9 F (36.6 C)-98.2 F (36.8 C)] 97.9 F (36.6 C) (04/20 0325) Pulse Rate:  [68-83] 68  (04/20 0325) Resp:  [18-20] 19  (04/20 0325) BP: (101-159)/(62-73) 159/73 mmHg (04/20 0325) SpO2:  [96 %-100 %] 99 % (04/20 0325) Weight:  [79.6 kg (175 lb 7.8 oz)-82.555 kg (182 lb)] 79.6 kg (175 lb 7.8 oz) (04/20 0325) Weight change:  Last BM Date: 04/04/12  Intake/Output from previous day:   Intake/Output this shift:    General appearance: alert and cooperative Resp: clear to auscultation bilaterally Cardio: regular rate and rhythm, S1, S2 normal, no murmur, click, rub or gallop Extremities: extremities normal, atraumatic, no cyanosis or edema  Lab Results:  Results for orders placed during the hospital encounter of 04/08/12 (from the past 24 hour(s))  CBC     Status: Abnormal   Collection Time   04/08/12  9:35 PM      Component Value Range   WBC 10.5  4.0 - 10.5 (K/uL)   RBC 3.89 (*) 4.22 - 5.81 (MIL/uL)   Hemoglobin 11.6 (*) 13.0 - 17.0 (g/dL)   HCT 16.1 (*) 09.6 - 52.0 (%)   MCV 88.2  78.0 - 100.0 (fL)   MCH 29.8  26.0 - 34.0 (pg)   MCHC 33.8  30.0 - 36.0 (g/dL)   RDW 04.5  40.9 - 81.1 (%)   Platelets 189  150 - 400 (K/uL)  DIFFERENTIAL     Status: Abnormal   Collection Time   04/08/12  9:35 PM      Component Value Range   Neutrophils Relative 74  43 - 77 (%)   Neutro Abs 7.8 (*) 1.7 - 7.7 (K/uL)   Lymphocytes Relative 11 (*) 12 - 46 (%)   Lymphs Abs 1.2  0.7 - 4.0 (K/uL)   Monocytes Relative 14 (*) 3 - 12 (%)   Monocytes Absolute 1.5 (*) 0.1 - 1.0 (K/uL)   Eosinophils Relative 1  0 - 5 (%)   Eosinophils Absolute 0.1  0.0 - 0.7 (K/uL)   Basophils Relative 0  0 - 1 (%)   Basophils Absolute 0.0  0.0 - 0.1 (K/uL)  BASIC  METABOLIC PANEL     Status: Abnormal   Collection Time   04/08/12  9:35 PM      Component Value Range   Sodium 134 (*) 135 - 145 (mEq/L)   Potassium 3.6  3.5 - 5.1 (mEq/L)   Chloride 100  96 - 112 (mEq/L)   CO2 27  19 - 32 (mEq/L)   Glucose, Bld 134 (*) 70 - 99 (mg/dL)   BUN 15  6 - 23 (mg/dL)   Creatinine, Ser 9.14  0.50 - 1.35 (mg/dL)   Calcium 8.5  8.4 - 78.2 (mg/dL)   GFR calc non Af Amer 72 (*) >90 (mL/min)   GFR calc Af Amer 84 (*) >90 (mL/min)  URINALYSIS, ROUTINE W REFLEX MICROSCOPIC     Status: Abnormal   Collection Time   04/08/12 11:31 PM      Component Value Range   Color, Urine AMBER (*) YELLOW    APPearance CLEAR  CLEAR    Specific Gravity, Urine 1.027  1.005 - 1.030    pH 7.5  5.0 - 8.0    Glucose, UA NEGATIVE  NEGATIVE (mg/dL)   Hgb urine dipstick NEGATIVE  NEGATIVE    Bilirubin Urine SMALL (*) NEGATIVE    Ketones, ur 15 (*) NEGATIVE (mg/dL)   Protein, ur 30 (*) NEGATIVE (mg/dL)   Urobilinogen, UA 1.0  0.0 - 1.0 (mg/dL)   Nitrite NEGATIVE  NEGATIVE    Leukocytes, UA TRACE (*) NEGATIVE   URINE MICROSCOPIC-ADD ON     Status: Normal   Collection Time   04/08/12 11:31 PM      Component Value Range   Squamous Epithelial / LPF RARE  RARE    WBC, UA 0-2  <3 (WBC/hpf)   Urine-Other MUCOUS PRESENT        Studies/Results: Dg Thoracic Spine 2 View  04/08/2012  *RADIOLOGY REPORT*  Clinical Data: Back and shoulder pain after fall  THORACIC SPINE - 2 VIEW  Comparison: 04/05/2012 and 03/28/2012  chest.  Findings: Mild thoracic scoliosis.  Diffuse degenerative changes throughout the thoracic spine.  Multiple sclerotic bone lesions throughout the thoracic spine consistent with sclerotic metastasis. This appears stable since the previous study.  No paraspinal soft tissue swelling.  No abnormal anterior subluxation.  IMPRESSION: Diffuse bony metastasis and degenerative changes throughout the thoracic spine which appears stable since previous chest radiograph.  Original Report  Authenticated By: Marlon Pel, M.D.   Dg Lumbar Spine Complete  04/08/2012  *RADIOLOGY REPORT*  Clinical Data: Back and shoulder pain after fall.  LUMBAR SPINE - COMPLETE 4+ VIEW  Comparison: Abdomen 03/29/2012  Findings: Multiple areas of sclerosis demonstrated throughout the lumbar spine and pelvis consistent with sclerotic bone metastases. Sclerotic bone lesions have increased since the prior CT scan from 07/31/2011.  There is compression of the L1 vertebra which appears stable since that CT scan.  There is mild compression of L4 which appears increased.  Mild anterior subluxation of L4 on L5, stable. Otherwise normal alignment.  Vascular calcifications.  Degenerative changes in the left hip.  Right hip prosthesis.  IMPRESSION: Diffuse bony metastasis with progression since previous CT scan. Stable compression of L1 with mild new compression of L4 since 07/31/2011.  Original Report Authenticated By: Marlon Pel, M.D.   Dg Shoulder Right  04/08/2012  *RADIOLOGY REPORT*  Clinical Data: Back and shoulder pain after fall.  RIGHT SHOULDER - 2+ VIEW  Comparison: None.  Findings: Focal sclerosis in the glenoid region of the scapula consistent with bone metastasis.  Additional focal areas of sclerosis demonstrated in multiple right upper ribs consistent with metastasis.  Degenerative changes in the acromioclavicular and glenohumeral joints.  No acute fracture or subluxation is suggested.  IMPRESSION: Focal sclerosis in the glenoid consistent with bone metastasis. Multiple right rib metastasis.  No evidence of acute fracture. Degenerative changes.  Original Report Authenticated By: Marlon Pel, M.D.    Medications:  Prior to Admission:  Prescriptions prior to admission  Medication Sig Dispense Refill  . acetaminophen (TYLENOL) 500 MG tablet Take 500 mg by mouth every 6 (six) hours as needed. For pain      . amiodarone (PACERONE) 100 MG tablet Take 50 mg by mouth daily.      .  Cholecalciferol (VITAMIN D3) 1000 UNITS CAPS Take 1 capsule by mouth daily.        Marland Kitchen doxazosin (CARDURA) 2 MG tablet Take 2 mg by mouth at bedtime.        . feeding supplement (ENSURE COMPLETE) LIQD Take 237 mLs by mouth 2 (two) times daily between meals.  5 Bottle  5  . fentaNYL (  DURAGESIC) 50 MCG/HR Place 1 patch (50 mcg total) onto the skin every 3 (three) days. **HOSPICE PATIENT**  10 patch  0  . finasteride (PROSCAR) 5 MG tablet Take 5 mg by mouth daily.        Marland Kitchen LORazepam (ATIVAN) 0.5 MG tablet Take 1 tablet (0.5 mg total) by mouth every 4 (four) hours as needed for anxiety.  30 tablet  0  . Multiple Vitamin (MULTIVITAMIN) tablet Take 1 tablet by mouth daily.        . OMEGA 3 1200 MG CAPS Take 2 capsules by mouth daily.        Marland Kitchen senna-docusate (SENOKOT-S) 8.6-50 MG per tablet Take 1 tablet by mouth at bedtime.  30 tablet  3  . promethazine (PHENERGAN) 12.5 MG tablet Take 1 tablet (12.5 mg total) by mouth every 6 (six) hours as needed for nausea.  30 tablet  3   Scheduled:   . amiodarone  50 mg Oral Daily  . docusate sodium  100 mg Oral BID  . doxazosin  2 mg Oral QHS  . feeding supplement  237 mL Oral BID BM  . fentaNYL  50 mcg Transdermal Q72H  . finasteride  5 mg Oral Daily  . heparin  5,000 Units Subcutaneous Q8H  .  HYDROmorphone (DILAUDID) injection  1 mg Intravenous Once  . ondansetron (ZOFRAN) IV  4 mg Intravenous Once  . senna  1 tablet Oral BID  . senna-docusate  1 tablet Oral QHS  . sodium chloride  3 mL Intravenous Q12H   Continuous:   Assessment/Plan: @HPROL @  Principal Problem:  *Fall Active Problems:  Weakness generalized  Gait abnormality  Prostate cancer metastatic to multiple sites  Continue supportive measures, await further input by case management   LOS: 1 day   Leisel Pinette D 04/09/2012, 11:12 AM
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
## 2012-04-09 NOTE — ED Provider Notes (Signed)
Medical screening examination/treatment/procedure(s) were conducted as a shared visit with non-physician practitioner(s) and myself.  I personally evaluated the patient during the encounter  Ethelda Chick, MD 04/09/12 [phone]

## 2012-04-09 NOTE — Progress Notes (Signed)
RM [id number]           [name].                 [facility] SW Note:  Pt denied pain and recounted events leading to this hospitalization. His preference is not to return to [facility]. He is uncertain about his disposition. Spoke with pt's son in law [name] this a.m. He would like pt to be evaluated for [facility]. Explained that the primary hospice SW and hospital CSW will follow up with pt and with him regarding d/c plans. However, this SW will visit with pt again tomorrow and provide him with an update on pt's condition. Exie Parody Shon Hough, LCSWA
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
## 2012-04-09 NOTE — Progress Notes (Signed)
DNR.  Uncertain if this is a related admission.  Patient is awake and verbal.  No family present at the bedside.  Patient is reporting 5/10 pain to his back, right shoulder, right flank, and hips.  He describes the pain as sharp and states, " the pain is huge."  Patient does not wish to go back to [facility].  He would like to go to [facility] and states his desire is to remain here at [facility] as long as possible.  He reports he is "surrounded by professional care that makes him feel secure."  Requested by both Arline Asp, RN and also patient to contact the son-in-law [name] on his cell phone today. Phone call made to [name] and he is questioning what the next step is for patient.  He is aware of bony mets as documented in radiology reports.  Advised that patient was just admitted to the floor early this morning and there are no plans for discharge at this time.  [name] is also questioning what needs to be done regarding the room at [facility].  Advised that I would have Chile Truesdale, [facility] SW contact them regarding this issue.  Son is requesting updates from hospice over the weekend.  Advised that we would continue to see patient through his admission here and that we could contact him with updates. Please contact [facility] [phone] with questions or concerns.

## 2012-04-10 DIAGNOSIS — G8929 Other chronic pain: Secondary | ICD-10-CM

## 2012-04-10 MED ORDER — HYDROMORPHONE HCL PF 2 MG/ML IJ SOLN
2.0000 mg | INTRAMUSCULAR | Status: DC | PRN
  Administered 2012-04-11 (×2): 2 mg via INTRAVENOUS
  Filled 2012-04-10 (×2): qty 1

## 2012-04-10 NOTE — Progress Notes (Signed)
DNR.  Uncertain if admission is related at this time.  Patient is awake and verbal sitting up in the bed.  He reports having a good appetite today.  Pain is a 6-7 out of 10 at present.  He has taken pain medications prior to my visit.  Still no bm as of yet.  He is anxious to have a shower and also bm.  No other concerns are verbalized by patient today.  No family present at bedside.  Please call [facility] [phone] with questions or concerns.

## 2012-04-10 NOTE — Progress Notes (Signed)
Subjective: Mr. Pinson is well known to me. Record reviewed including ED visit for suicide attempt and the events leading to this admission. He has widely metastatic disease to bone with a progressive pain burden that requires nursing management. He is no longer able to ambulate or manage his ADLs. He has had poor appetite with progressive weight loss.  At this time his pain is adequately controlled. He is still constipated   Objective: Lab: no new lab Imaging: no new imaging  Physical Exam: Filed Vitals:   04/10/12 0520  BP: 127/64  Pulse: 73  Temp: 99 F (37.2 C)  Resp: 16   Awake and alert, in no distress Cor - RRR Pulm - normal respirations Neuro - awake and alert. Rational but not depressed   Assessment/Plan: 1. Oncology - patient with progressive metastatic prostate cancer with wide spread bone involvement. He has had progressive pain now requiring duragesic patches and breakthrough medication. He has had a rapid decline: from independent living to a need for skilled care to meet his ADLs and management of his pain medications. He is already involved in hospice. With his progressive metastatic disease and progressive inanition he is a candidate for [facility].   Plan - continue pain management as per current regimen: duragesic patch plus oxyIR as needed, along with ativan as    Needed.            Disposition: evaluate for [facility]. If not accepted at [facility] he will need SNF - recommended [facility],   Or [facility] or [facility].   Casimiro Needle Sharena Dibenedetto 04/10/2012, 12:22 PM

## 2012-04-10 NOTE — Evaluation (Signed)
Physical Therapy Evaluation Patient Details Name: [name] MRN: [id number] DOB: 02-Dec-1922 Today's Date: 04/10/2012 Time: 1478-2956 PT Time Calculation (min): 31 min  PT Assessment / Plan / Recommendation Clinical Impression  pt admitted  from ALF after fall. pt recently a cut his wrists. Pt has metastatic bone cancer  pt will benefit from PT to improve mobility, safety  to DC to next venue    PT Assessment  Patient needs continued PT services    Follow Up Recommendations  Skilled nursing facility    Equipment Recommendations  None recommended by PT    Frequency Min 3X/week    Precautions / Restrictions Precautions Precautions: Fall Precaution Comments: pt has 1 1/2 inches shorter LLE   Pertinent Vitals/Pain 8/10  RN brought pain meds      Mobility  Bed Mobility Bed Mobility: Supine to Sit Supine to Sit: 7: Independent Transfers Transfers: Sit to Stand;Stand to Sit Sit to Stand: 4: Min assist;From bed;With upper extremity assist;From elevated surface Stand to Sit: 4: Min guard;To chair/3-in-1;With upper extremity assist Details for Transfer Assistance: pt noted to have some mild trembling in LEs. pt takes extra time for activity as pt works through his pain Ambulation/Gait Ambulation/Gait Assistance: 1: +2 Total assist Ambulation/Gait: Patient Percentage: 80 Ambulation Distance (Feet): 25 Feet Assistive device: Rolling Dimaggio Ambulation/Gait Assistance Details: pt's LLE tended to stay out of RW.  VC for sequence and posture and to keep LLe inside RW Gait Pattern: Step-through pattern;Decreased step length - left Gait velocity: slower than normal    Exercises     PT Goals Acute Rehab PT Goals PT Goal Formulation: With patient Time For Goal Achievement: 04/24/12 Potential to Achieve Goals: Good Pt will go Sit to Stand: with supervision PT Goal: Sit to Stand - Progress: Goal set today Pt will go Stand to Sit: with supervision;with modified independence PT  Goal: Stand to Sit - Progress: Goal set today Pt will Ambulate: >150 feet;with supervision;with least restrictive assistive device PT Goal: Ambulate - Progress: Goal set today  Visit Information  Last PT Received On: 04/10/12 Assistance Needed: +2    Subjective Data  Subjective: i m going to die. what elase can I do Patient Stated Goal: II"ll walk as best as I can   Prior Functioning  Home Living Lives With:  (was at ALF)    Cognition  Overall Cognitive Status: Appears within functional limits for tasks assessed/performed Arousal/Alertness: Awake/alert Orientation Level: Appears intact for tasks assessed Behavior During Session: [facility] for tasks performed    Extremity/Trunk Assessment Right Upper Extremity Assessment RUE ROM/Strength/Tone: [facility] for tasks assessed Left Upper Extremity Assessment LUE ROM/Strength/Tone: WFL for tasks assessed Right Lower Extremity Assessment RLE ROM/Strength/Tone: Within functional levels RLE Sensation: WFL - Light Touch Left Lower Extremity Assessment LLE ROM/Strength/Tone: Deficits LLE ROM/Strength/Tone Deficits: LLE is shortened and tends to ER LLE Sensation: WFL - Light Touch Trunk Assessment Trunk Assessment: Normal   Balance    End of Session PT - End of Session Activity Tolerance: Patient limited by pain Patient left: in chair;with call bell/phone within reach Nurse Communication: Mobility status   Rada Hay 04/10/2012, 4:05 PM  [phone]

## 2012-04-10 NOTE — Progress Notes (Signed)
Spoke with Dr Nehemiah Settle concerning phone call from [facility], [facility] has accepted Mr Sassano as a pt and can admit him on Monday 04/11/12.

## 2012-04-10 NOTE — Progress Notes (Signed)
Rm   1317             [name].                     [facility] SW Note:  Pt reported pain and requested pain medication while SW was present. He desired to process disposition options. He prefers SNF at the [facility] or [facility]. Discussed pros and cons of both. Explained that there is a cost to [facility] and if needed a financial assessment can be done. SW will leave a message regarding all of the aforementioned for pt's primary SW Louie Boston so that she and the hospital CSW will be aware of pt's preferences. SW will also leave a message for hospice hospital liason RN Dot Lanes regarding the desire for [facility] referral and [facility] director Robet Leu to update on pt's status.Exie Parody Shon Hough, LCSWA

## 2012-04-11 ENCOUNTER — Telehealth: Payer: Self-pay | Admitting: Internal Medicine

## 2012-04-11 NOTE — Progress Notes (Addendum)
Room 1317 - [name]   [facility]  [facility] RN Visit  Related admission to [facility] diagnosis of metastatic prostate cancer.   Pt is DNR code.    Pt alert & oriented, lying in bed, with complaints of pain & discomfort at 7/10.  Staff RN gave pain meds during visit.  Pt did exhibit twitching & moving  as if to reposition due to pain in the back.   No family present.    All info (H&P, MAR, most recent progress notes, imaging reports, labs) have been faxed/routed to [facility].  [facility] SW & [facility] Liaison involved in move to [facility] today approximately 11am.  Please call [facility] @ [phone] with any needs.  Thank you.  Joneen Boers, RN  [facility]  Hospice Liaison

## 2012-04-11 NOTE — Progress Notes (Signed)
Patient with no significant change  - he continues to have diffuse pain.  He has been accept at [facility] and is for transfer today.  Dictation # [id number]

## 2012-04-11 NOTE — Telephone Encounter (Signed)
Called the son-in-law [name]- [phone]) about the paperwork ([place]-2 and application for  long term healthcare) and the son-in-law stated the emergency room dr filled out all needed paperwork since we would not be able to provide a signature from Dr.[name] until Friday April 19th.

## 2012-04-11 NOTE — Clinical Documentation Improvement (Signed)
GENERIC DOCUMENTATION CLARIFICATION QUERY  THIS DOCUMENT IS NOT A PERMANENT PART OF THE MEDICAL RECORD  TO RESPOND TO THE THIS QUERY, FOLLOW THE INSTRUCTIONS BELOW:  1. If needed, update documentation for the patient's encounter via the notes activity.  2. Access this query again and click edit on the In [employer].  3. After updating, or not, click F2 to complete all highlighted (required) fields concerning your review. Select "additional documentation in the medical record" OR "no additional documentation provided".  4. Click Sign note button.  5. The deficiency will fall out of your In Basket *Please let us know if you are not able to complete this workflow by phone or e-mail (listed below).  Please update your documentation within the medical record to reflect your response to this query.                                                                                        04/11/12   Dear Dr. Kaylyn Layer, [facility],  In a better effort to capture your patient's severity of illness, reflect appropriate length of stay and utilization of resources, a review of the patient medical record has revealed the following indicators.    Based on your clinical judgment, please clarify and document in a progress note and/or discharge summary the clinical condition associated with the following supporting information:  In responding to this query please exercise your independent judgment.  The fact that a query is asked, does not imply that any particular answer is desired or expected.  Pt admitted with fall.   According to [facility] pt with compression fx lumbar L1 and new fx to L4.  Radiology reports listed below stated pt with compression fx of L1 and new L4 i  Please clarify whether or not compression fx of L1 and new L4 n setting bony mets.can be further specified as one of the diagnosis listed below and document in pn or d/c summary.    Possible Clinical Conditions?  Compression fracture,  pathological   Compression fracture, spine  _______Other Condition__________________ _______Cannot Clinically Determine   Supporting Information: [facility] end stage prostate ca with bony mets on hospice care, a  Risk Factors: Fall, pain right side, Gait abnormality, Prostate cancer, OA  Signs & Symptoms: Weakness,   Diagnostics: DG Right shoulder 04/08/12  IMPRESSION: Focal sclerosis in the glenoid consistent with bone metastasis. Multiple right rib metastasis.  Lumbar Spine Complete: 04/08/12    IMPRESSION: Diffuse bony metastasis with progression since previous CT scan. Stable compression of L1 with mild new compression of L4 since 07/31/2011  DG Thoracic Spine 04/08/12 IMPRESSION: Diffuse bony metastasis and degenerative changes throughout the thoracic spine which appears stable since previous chest radiograph.  Treatment DURAGESIC TYLENOL  You may use possible, probable, or suspect with inpatient documentation. possible, probable, suspected diagnoses MUST be documented at the time of discharge  Reviewed: additional documentation in the medical record.  New L4 compression fracture and old L1 compression fracture. They are pathological compression fractures.   Thank You,  Enis Slipper  RN, BSN, CCDS Clinical Documentation Specialist [facility] HIM Dept Pager: [phone] / E-mail: [email or web address]  Health Information Management [place]

## 2012-04-12 DIAGNOSIS — G8929 Other chronic pain: Secondary | ICD-10-CM

## 2012-04-12 NOTE — Discharge Summary (Signed)
NAMENOEH, SPARACINO NO.:  [id number]  MEDICAL RECORD NO.:  [id number]  LOCATION:  1317                         FACILITY:  [facility]  PHYSICIAN:  Rosalyn Gess. Ameliya Nicotra, MD  DATE OF BIRTH:  1922-11-20  DATE OF ADMISSION:  04/08/2012 DATE OF DISCHARGE:  04/11/2012                              DISCHARGE SUMMARY   ADMITTING DIAGNOSES: 1. Refractory pain from metastatic prostate disease. 2. Known coronary artery disease with left anterior descending     obstruction. 3. Atrial fibrillation, but not a candidate for anticoagulation     therapy.  DISCHARGE DIAGNOSES: 1. Refractory pain from metastatic prostate disease. 2. Known coronary artery disease with left anterior descending     obstruction. 3. Atrial fibrillation, but not a candidate for anticoagulation     therapy.  HISTORY OF PRESENT ILLNESS:  Mr. Meacham is a delightful 78 year old gentleman, who over the last several years developed multiple medical problems including atrial fibrillation and known CAD.  The patient has had a presumptive diagnosis of prostate cancer with a CT and x-ray evidence of bony metastasis.  The patient has been adamant about not pursuing further evaluation, so further diagnostic studies have not been done.  The patient also has declined any treatment.  Over the past several months, the patient has had a very rapid and precipitous decline.  He has become immobilized with great difficulty in ambulation and unsteadiness on his feet.  He has had multiple falls. His pain is chronic and severe, although currently controlled with fentanyl patch and breakthrough Oxy IR.  He has had inanition with loss of weight along with poor appetite.  The patient had been recently in the hospital and at time of discharge, he was moved to assisted living.  After several days, the patient had a fall sustaining significant bruising to the shoulder and on x-ray in the emergency department found to have  metastatic lesion in the shoulder. His need at this point in regards to pain management and assistance with his ADL, it seems that that can be provided at assisted living, and he is a candidate for skilled care.  The patient is very clear about his end of life wishes, which is comfort care only with no return to hospital.  No significant interventions.  No heroic measures.  No resuscitation.  During his hospital stay, the patient has been doing relatively well. He has been getting pain medication on a regular basis and his pain is reasonably controlled, although this morning, he reports that the pain is worse across the shoulders.  Given the patient's rapid and progressive decline at this point, he will be transferred to [facility] for residential hospice care.  Please see the admission note as well as prior epic notes for past medical history, family history, social history as well as admission examination.  PROCEDURES:  This admission, chest x-ray on April 08, 2012 with diffuse bony metastasis with progression since previous CT scan August '12- stable compression fracture of L1 with mild new compression fracture of L4.  X-ray of right shoulder revealed focal sclerosis in the glenoid consistent with a bone metastasis.  Multiple right rib metastases are noted.  There is no  evidence of acute fracture.  He does have degenerative changes in the right shoulder.  Next, his thoracic spine films performed on the day of admission with diffuse bony metastasis and degenerative changes throughout the thoracic spine, which does appear stable since his previous chest x-ray.  Final laboratories from April 08, 2012:  Sodium 134, potassium 3.6, chloride 100, CO2 of 27, BUN of 15, creatinine 0.94, calcium 8.5, glucose was 134.  Alkaline phosphatase from April 04, 2012, was 218. Albumin 2.7, AST is 22, ALT of 14, total protein was 6.0.  CBC from April 08, 2012, with a hemoglobin of 11.6 g, white  count 10500 with a normal differential.  PHYSICAL EXAMINATION:  VITAL SIGNS:  Temperature was 98.8, blood pressure 159/73, pulse was 73, respirations 20, O2 sats 99% on room air. GENERAL APPEARANCE:  This is a thin elderly Caucasian male, who is in no acute distress, but is clearly uncomfortable with any movement. HEENT:  Significant for loss of weight about the facial structures with mild temporal wasting.  Conjunctivae and sclerae were clear.  Pupils equal, round, and reactive. NECK:  Supple.  There was no thyromegaly. CHEST:  The patient has a mild kyphosis.  He has significant CVA tenderness. PULMONARY:  The patient has no respiratory distress.  There is no rales, wheezes, or rhonchi on examination. CARDIOVASCULAR EXAM:  2+ radial pulses.  Precordium is quiet.  He had an irregular heart rhythm.  No murmur was appreciated. ABDOMEN:  Soft. EXTREMITIES:  No deformities are noted. DERMATOLOGIC:  The patient has no obvious skin lesions with multiple bruisings noted. NEUROLOGIC:  The patient is awake, alert.  He is oriented to person, place, time, and context.  Cranial nerves intact.  DISPOSITION:  The patient is to be transferred to [facility] for residential hospice Care.  Dr. [name] will be the attending and will co- manage with hospice facility physicians.  The patient's condition at time of discharge dictation is hemodynamically stable.  Pain is suboptimally controlled, which will be addressed at [facility].  His prognosis is terminal.     Rosalyn Gess. Brittannie Tawney, MD     MEN/MEDQ  D:  04/11/2012  T:  04/12/2012  Job:  [id number]

## 2012-04-17 DIAGNOSIS — G8929 Other chronic pain: Secondary | ICD-10-CM

## 2012-04-18 ENCOUNTER — Telehealth: Payer: Self-pay | Admitting: Internal Medicine

## 2012-04-18 NOTE — Telephone Encounter (Signed)
The son-in-law [name] called and is hoping to speak with you directly regarding Mr.Poma.  He stated he would be available anytime.  His callback is - [phone].    Thanks!

## 2012-04-22 NOTE — Progress Notes (Signed)
This assessment/evaluation/note was completed by a Child psychotherapist of Social Work Warden/ranger and as Investment banker, corporate, I was immediately available for consultation/collaboration. I am in agreement with the contents and disposition reflected in the assessment/evaluation/note(s).   Manson Passey Walida Cajas ANN S , MSW, LCSWA 04/22/2012 7:24 AM [phone]

## 2012-05-11 ENCOUNTER — Ambulatory Visit: Payer: Medicare Other | Admitting: Cardiology

## 2012-06-13 ENCOUNTER — Ambulatory Visit: Payer: Self-pay | Admitting: Internal Medicine

## 2012-06-13 DIAGNOSIS — Z7901 Long term (current) use of anticoagulants: Secondary | ICD-10-CM

## 2012-06-13 DIAGNOSIS — I4892 Unspecified atrial flutter: Secondary | ICD-10-CM

## 2012-08-24 ENCOUNTER — Encounter: Payer: Self-pay | Admitting: General Practice

## 2013-05-21 DEATH — deceased

## 2013-12-21 DEATH — deceased

## 2014-02-12 ENCOUNTER — Telehealth: Payer: Self-pay

## 2014-02-12 NOTE — Telephone Encounter (Signed)
Patient past away @ [facility], [place], [place] per Iver Nestle in [place]   asw
